# Patient Record
Sex: Female | Born: 1945 | Race: White | Hispanic: No | Marital: Single
Health system: Southern US, Community
[De-identification: ages and names within clinical notes are randomized; demographics above are authoritative.]

## PROBLEM LIST (undated history)

## (undated) DIAGNOSIS — U071 COVID-19: Secondary | ICD-10-CM

## (undated) DIAGNOSIS — I4891 Unspecified atrial fibrillation: Secondary | ICD-10-CM

## (undated) DIAGNOSIS — G9341 Metabolic encephalopathy: Secondary | ICD-10-CM

## (undated) DIAGNOSIS — J9621 Acute and chronic respiratory failure with hypoxia: Secondary | ICD-10-CM

## (undated) DIAGNOSIS — N170 Acute kidney failure with tubular necrosis: Secondary | ICD-10-CM

---

## 2020-08-16 ENCOUNTER — Other Ambulatory Visit (HOSPITAL_COMMUNITY): Payer: Medicare Other

## 2020-08-16 ENCOUNTER — Inpatient Hospital Stay
Admission: RE | Admit: 2020-08-16 | Discharge: 2020-10-12 | Disposition: E | Payer: Medicare Other | Source: Other Acute Inpatient Hospital | Attending: Internal Medicine | Admitting: Internal Medicine

## 2020-08-16 DIAGNOSIS — I4891 Unspecified atrial fibrillation: Secondary | ICD-10-CM | POA: Diagnosis present

## 2020-08-16 DIAGNOSIS — N179 Acute kidney failure, unspecified: Secondary | ICD-10-CM

## 2020-08-16 DIAGNOSIS — Z9911 Dependence on respirator [ventilator] status: Secondary | ICD-10-CM

## 2020-08-16 DIAGNOSIS — Z431 Encounter for attention to gastrostomy: Secondary | ICD-10-CM

## 2020-08-16 DIAGNOSIS — R0603 Acute respiratory distress: Secondary | ICD-10-CM

## 2020-08-16 DIAGNOSIS — Z95828 Presence of other vascular implants and grafts: Secondary | ICD-10-CM

## 2020-08-16 DIAGNOSIS — Z931 Gastrostomy status: Secondary | ICD-10-CM

## 2020-08-16 DIAGNOSIS — J9621 Acute and chronic respiratory failure with hypoxia: Secondary | ICD-10-CM | POA: Diagnosis present

## 2020-08-16 DIAGNOSIS — Z9689 Presence of other specified functional implants: Secondary | ICD-10-CM

## 2020-08-16 DIAGNOSIS — R112 Nausea with vomiting, unspecified: Secondary | ICD-10-CM

## 2020-08-16 DIAGNOSIS — Z4659 Encounter for fitting and adjustment of other gastrointestinal appliance and device: Secondary | ICD-10-CM

## 2020-08-16 DIAGNOSIS — J811 Chronic pulmonary edema: Secondary | ICD-10-CM

## 2020-08-16 DIAGNOSIS — T859XXA Unspecified complication of internal prosthetic device, implant and graft, initial encounter: Secondary | ICD-10-CM

## 2020-08-16 DIAGNOSIS — J939 Pneumothorax, unspecified: Secondary | ICD-10-CM

## 2020-08-16 DIAGNOSIS — R1110 Vomiting, unspecified: Secondary | ICD-10-CM

## 2020-08-16 DIAGNOSIS — J189 Pneumonia, unspecified organism: Secondary | ICD-10-CM

## 2020-08-16 DIAGNOSIS — N170 Acute kidney failure with tubular necrosis: Secondary | ICD-10-CM | POA: Diagnosis present

## 2020-08-16 DIAGNOSIS — R0902 Hypoxemia: Secondary | ICD-10-CM

## 2020-08-16 DIAGNOSIS — J9690 Respiratory failure, unspecified, unspecified whether with hypoxia or hypercapnia: Secondary | ICD-10-CM

## 2020-08-16 DIAGNOSIS — U071 COVID-19: Secondary | ICD-10-CM | POA: Diagnosis present

## 2020-08-16 DIAGNOSIS — G9341 Metabolic encephalopathy: Secondary | ICD-10-CM | POA: Diagnosis present

## 2020-08-16 HISTORY — DX: Acute kidney failure with tubular necrosis: N17.0

## 2020-08-16 HISTORY — DX: Acute and chronic respiratory failure with hypoxia: J96.21

## 2020-08-16 HISTORY — DX: Metabolic encephalopathy: G93.41

## 2020-08-16 HISTORY — DX: COVID-19: U07.1

## 2020-08-16 HISTORY — DX: Unspecified atrial fibrillation: I48.91

## 2020-08-16 LAB — BLOOD GAS, ARTERIAL
Acid-base deficit: 0.9 mmol/L (ref 0.0–2.0)
Bicarbonate: 27 mmol/L (ref 20.0–28.0)
FIO2: 60
O2 Saturation: 94.2 %
Patient temperature: 37.2
pCO2 arterial: 79.5 mmHg (ref 32.0–48.0)
pH, Arterial: 7.158 — CL (ref 7.350–7.450)
pO2, Arterial: 81.3 mmHg — ABNORMAL LOW (ref 83.0–108.0)

## 2020-08-16 MED ORDER — IOHEXOL 180 MG/ML  SOLN
50.0000 mL | Freq: Once | INTRAMUSCULAR | Status: AC | PRN
  Administered 2020-08-16: 50 mL

## 2020-08-17 ENCOUNTER — Encounter: Payer: Self-pay | Admitting: Internal Medicine

## 2020-08-17 DIAGNOSIS — U071 COVID-19: Secondary | ICD-10-CM

## 2020-08-17 DIAGNOSIS — J9621 Acute and chronic respiratory failure with hypoxia: Secondary | ICD-10-CM | POA: Diagnosis not present

## 2020-08-17 DIAGNOSIS — N170 Acute kidney failure with tubular necrosis: Secondary | ICD-10-CM

## 2020-08-17 DIAGNOSIS — I4891 Unspecified atrial fibrillation: Secondary | ICD-10-CM

## 2020-08-17 DIAGNOSIS — G9341 Metabolic encephalopathy: Secondary | ICD-10-CM

## 2020-08-17 LAB — CBC WITH DIFFERENTIAL/PLATELET
Abs Immature Granulocytes: 0.31 10*3/uL — ABNORMAL HIGH (ref 0.00–0.07)
Basophils Absolute: 0.1 10*3/uL (ref 0.0–0.1)
Basophils Relative: 0 %
Eosinophils Absolute: 0.1 10*3/uL (ref 0.0–0.5)
Eosinophils Relative: 0 %
HCT: 30 % — ABNORMAL LOW (ref 36.0–46.0)
Hemoglobin: 8.9 g/dL — ABNORMAL LOW (ref 12.0–15.0)
Immature Granulocytes: 2 %
Lymphocytes Relative: 10 %
Lymphs Abs: 1.6 10*3/uL (ref 0.7–4.0)
MCH: 28.9 pg (ref 26.0–34.0)
MCHC: 29.7 g/dL — ABNORMAL LOW (ref 30.0–36.0)
MCV: 97.4 fL (ref 80.0–100.0)
Monocytes Absolute: 0.5 10*3/uL (ref 0.1–1.0)
Monocytes Relative: 3 %
Neutro Abs: 12.9 10*3/uL — ABNORMAL HIGH (ref 1.7–7.7)
Neutrophils Relative %: 85 %
Platelets: 316 10*3/uL (ref 150–400)
RBC: 3.08 MIL/uL — ABNORMAL LOW (ref 3.87–5.11)
RDW: 20.4 % — ABNORMAL HIGH (ref 11.5–15.5)
WBC: 15.4 10*3/uL — ABNORMAL HIGH (ref 4.0–10.5)
nRBC: 0 % (ref 0.0–0.2)

## 2020-08-17 LAB — BLOOD GAS, ARTERIAL
Acid-Base Excess: 0 mmol/L (ref 0.0–2.0)
Acid-base deficit: 0.8 mmol/L (ref 0.0–2.0)
Acid-base deficit: 1 mmol/L (ref 0.0–2.0)
Acid-base deficit: 0.7 mmol/L (ref 0.0–2.0)
Acid-base deficit: 0.5 mmol/L (ref 0.0–2.0)
Bicarbonate: 26.2 mmol/L (ref 20.0–28.0)
Bicarbonate: 26.3 mmol/L (ref 20.0–28.0)
Bicarbonate: 25.6 mmol/L (ref 20.0–28.0)
Bicarbonate: 26.2 mmol/L (ref 20.0–28.0)
Bicarbonate: 26.1 mmol/L (ref 20.0–28.0)
FIO2: 60
FIO2: 55
FIO2: 55
FIO2: 55
FIO2: 60
O2 Saturation: 97.2 %
O2 Saturation: 94.6 %
O2 Saturation: 96.6 %
O2 Saturation: 95.5 %
O2 Saturation: 98.4 %
Patient temperature: 36.9
Patient temperature: 36.8
Patient temperature: 36.4
Patient temperature: 37.3
Patient temperature: 37
pCO2 arterial: 67.8 mmHg (ref 32.0–48.0)
pCO2 arterial: 59.8 mmHg — ABNORMAL HIGH (ref 32.0–48.0)
pCO2 arterial: 60.5 mmHg — ABNORMAL HIGH (ref 32.0–48.0)
pCO2 arterial: 68.3 mmHg (ref 32.0–48.0)
pCO2 arterial: 63.5 mmHg — ABNORMAL HIGH (ref 32.0–48.0)
pH, Arterial: 7.212 — ABNORMAL LOW (ref 7.350–7.450)
pH, Arterial: 7.264 — ABNORMAL LOW (ref 7.350–7.450)
pH, Arterial: 7.245 — ABNORMAL LOW (ref 7.350–7.450)
pH, Arterial: 7.211 — ABNORMAL LOW (ref 7.350–7.450)
pH, Arterial: 7.237 — ABNORMAL LOW (ref 7.350–7.450)
pO2, Arterial: 91.7 mmHg (ref 83.0–108.0)
pO2, Arterial: 72.5 mmHg — ABNORMAL LOW (ref 83.0–108.0)
pO2, Arterial: 83.9 mmHg (ref 83.0–108.0)
pO2, Arterial: 82.4 mmHg — ABNORMAL LOW (ref 83.0–108.0)
pO2, Arterial: 112 mmHg — ABNORMAL HIGH (ref 83.0–108.0)

## 2020-08-17 LAB — COMPREHENSIVE METABOLIC PANEL
ALT: 48 U/L — ABNORMAL HIGH (ref 0–44)
AST: 25 U/L (ref 15–41)
Albumin: 2.4 g/dL — ABNORMAL LOW (ref 3.5–5.0)
Alkaline Phosphatase: 113 U/L (ref 38–126)
Anion gap: 12 (ref 5–15)
BUN: 124 mg/dL — ABNORMAL HIGH (ref 8–23)
CO2: 26 mmol/L (ref 22–32)
Calcium: 8.5 mg/dL — ABNORMAL LOW (ref 8.9–10.3)
Chloride: 105 mmol/L (ref 98–111)
Creatinine, Ser: 2.13 mg/dL — ABNORMAL HIGH (ref 0.44–1.00)
GFR calc non Af Amer: 22 mL/min — ABNORMAL LOW (ref >60)
Glucose, Bld: 164 mg/dL — ABNORMAL HIGH (ref 70–99)
Potassium: 5.3 mmol/L — ABNORMAL HIGH (ref 3.5–5.1)
Sodium: 143 mmol/L (ref 135–145)
Total Bilirubin: 0.4 mg/dL (ref 0.3–1.2)
Total Protein: 5.9 g/dL — ABNORMAL LOW (ref 6.5–8.1)

## 2020-08-17 LAB — PHOSPHORUS: Phosphorus: 6.6 mg/dL — ABNORMAL HIGH (ref 2.5–4.6)

## 2020-08-17 LAB — PROTIME-INR
INR: 1.2 (ref 0.8–1.2)
Prothrombin Time: 15.1 seconds (ref 11.4–15.2)

## 2020-08-17 LAB — MAGNESIUM: Magnesium: 2 mg/dL (ref 1.7–2.4)

## 2020-08-17 NOTE — Consult Note (Signed)
Pulmonary Critical Care Medicine [facility] [place]  PULMONARY SERVICE  Date of Service: 08/17/2020  PULMONARY CRITICAL CARE CONSULT   Aimee Peck  XBL:[id number]  DOB: 1945/12/23   DOA: 09-14-20  Referring Physician: Carron Curie, MD  HPI: Aimee Peck is a 74 y.o. female seen for follow up of Acute on Chronic Respiratory Failure.  Patient has multiple medical problems including hypertension hyperlipidemia diabetes anxiety depression chronic pain syndrome morbid obesity atrial fibrillation came into the hospital because of cough body aches shortness of breath.  Patient was evaluated for COVID-19 was found to be positive.  Patient was started on remdesivir Decadron and Ticlid.  In addition was placed on antibiotics for MRSA pneumonia.  Her status continued to decline patient ended up intubated on the ventilator and subsequently was not able to come off of the ventilator.  She have to have a tracheostomy done transferred now to our facility for further management.  Review of Systems:  ROS performed and is unremarkable other than noted above.  Past medical history: Hypertension  hyperlipidemia  diabetes  anxiety  depression  chronic pain  morbid obesity  atrial fibrillation  COVID-19 MRSA bacteremia and fungal pneumonia  Past surgical history: Tracheostomy  Social history: Unknown tobacco alcohol or drug abuse  Family history: Noncontributory  Medications: Reviewed on Rounds  Physical Exam:  Vitals: Temperature is 98.2 pulse 88 respiratory 24 blood pressure is 115/57 saturations 96%  Ventilator Settings on pressure control mode was switched over to assist control mode because of asynchrony actually doing better on assist control FiO2 of 60% tidal volume 460 PEEP of 10  . General: Comfortable at this time . Eyes: Grossly normal lids, irises & conjunctiva . ENT: grossly tongue is normal . Neck: no obvious mass . Cardiovascular: S1-S2 normal no  gallop or rub . Respiratory: Coarse breath sounds noted bilaterally . Abdomen: Soft and nontender . Skin: no rash seen on limited exam . Musculoskeletal: not rigid . Psychiatric:unable to assess . Neurologic: no seizure no involuntary movements         Labs on Admission:  Basic Metabolic Panel: Recent Labs  Lab 08/17/20 1018  NA 143  K 5.3*  CL 105  CO2 26  GLUCOSE 164*  BUN 124*  CREATININE 2.13*  CALCIUM 8.5*  MG 2.0  PHOS 6.6*    Recent Labs  Lab 2020-09-14 2155 08/17/20 0100 08/17/20 0543 08/17/20 0930 08/17/20 1530  PHART 7.158* 7.212* 7.264* 7.245* 7.211*  PCO2ART 79.5* 67.8* 59.8* 60.5* 68.3*  PO2ART 81.3* 91.7 72.5* 83.9 82.4*  HCO3 27.0 26.2 26.3 25.6 26.2  O2SAT 94.2 97.2 94.6 96.6 95.5    Liver Function Tests: Recent Labs  Lab 08/17/20 1018  AST 25  ALT 48*  ALKPHOS 113  BILITOT 0.4  PROT 5.9*  ALBUMIN 2.4*   No results for input(s): LIPASE, AMYLASE in the last 168 hours. No results for input(s): AMMONIA in the last 168 hours.  CBC: Recent Labs  Lab 08/17/20 1018  WBC 15.4*  NEUTROABS 12.9*  HGB 8.9*  HCT 30.0*  MCV 97.4  PLT 316    Cardiac Enzymes: No results for input(s): CKTOTAL, CKMB, CKMBINDEX, TROPONINI in the last 168 hours.  BNP (last 3 results) No results for input(s): BNP in the last 8760 hours.  ProBNP (last 3 results) No results for input(s): PROBNP in the last 8760 hours.   Radiological Exams on Admission: DG ABDOMEN PEG TUBE LOCATION  Result Date: Sep 14, 2020 CLINICAL DATA:  Check gastrostomy catheter placed  EXAM: ABDOMEN - 1 VIEW COMPARISON:  None. FINDINGS: Gastrostomy catheter is noted within the stomach. Injected contrast passes into the stomach and subsequently into the duodenum. No obstructive changes are seen. IMPRESSION: Gastrostomy catheter within the stomach. Electronically Signed   By: Alcide Clever M.D.   On: 09-05-2020 22:44   DG Chest Port 1 View  Result Date: 09/05/20 CLINICAL DATA:  History of  respiratory failure EXAM: PORTABLE CHEST 1 VIEW COMPARISON:  None FINDINGS: Cardiac shadow is mildly enlarged but accentuated by the portable technique. Tracheostomy tube and right jugular central line are seen in satisfactory position. Diffuse airspace opacities are noted bilaterally. No sizable effusion or pneumothorax is noted. No bony abnormality is seen. IMPRESSION: Diffuse airspace opacities bilaterally. Although portion of this may be related to pulmonary edema possibility of underlying multifocal pneumonia deserves consideration as well. Clinical correlation is recommended. Electronically Signed   By: Alcide Clever M.D.   On: 09/05/20 22:44    Assessment/Plan Active Problems:   Acute on chronic respiratory failure with hypoxia (HCC)   COVID-19 virus infection   Atrial fibrillation with RVR (HCC)   Acute renal failure due to tubular necrosis (HCC)   Metabolic encephalopathy   1. Acute on chronic respiratory failure with hypoxia patient is on the ventilator we had to switch her over to assist control.  Reviewed the chest chest x-ray which reveals diffuse bilateral infiltrates.  There may very well be some element of heart failure. 2. COVID-19 virus infection in recovery phase patient has diffuse damage noted from a pulmonary perspective. 3. Atrial fibrillation with RVR we will continue with controlling the rate.  Patient did have a echo done with an EF of 65%. 4. Acute renal failure tubular necrosis creatinine 2.13 BUN 124 May need to consider nephrology evaluation. 5. Metabolic encephalopathy multifactorial we will continue to monitor.  I have personally seen and evaluated the patient, evaluated laboratory and imaging results, formulated the assessment and plan and placed orders. The Patient requires high complexity decision making with multiple systems involvement.  Case was discussed on Rounds with the Respiratory Therapy Director and the Respiratory staff Time Spent   Yevonne Pax, MD [facility] Pulmonary Critical Care Medicine Sleep Medicine

## 2020-08-18 DIAGNOSIS — I4891 Unspecified atrial fibrillation: Secondary | ICD-10-CM | POA: Diagnosis not present

## 2020-08-18 DIAGNOSIS — J9621 Acute and chronic respiratory failure with hypoxia: Secondary | ICD-10-CM | POA: Diagnosis not present

## 2020-08-18 DIAGNOSIS — N170 Acute kidney failure with tubular necrosis: Secondary | ICD-10-CM | POA: Diagnosis not present

## 2020-08-18 DIAGNOSIS — U071 COVID-19: Secondary | ICD-10-CM | POA: Diagnosis not present

## 2020-08-18 LAB — BLOOD GAS, ARTERIAL
Acid-Base Excess: 2.9 mmol/L — ABNORMAL HIGH (ref 0.0–2.0)
Bicarbonate: 28.3 mmol/L — ABNORMAL HIGH (ref 20.0–28.0)
FIO2: 50
O2 Saturation: 96.5 %
Patient temperature: 37
pCO2 arterial: 54.6 mmHg — ABNORMAL HIGH (ref 32.0–48.0)
pH, Arterial: 7.335 — ABNORMAL LOW (ref 7.350–7.450)
pO2, Arterial: 78.3 mmHg — ABNORMAL LOW (ref 83.0–108.0)

## 2020-08-18 LAB — URINALYSIS, ROUTINE W REFLEX MICROSCOPIC
Bilirubin Urine: NEGATIVE
Glucose, UA: NEGATIVE mg/dL
Ketones, ur: NEGATIVE mg/dL
Nitrite: NEGATIVE
Protein, ur: 30 mg/dL — AB
Specific Gravity, Urine: 1.012 (ref 1.005–1.030)
pH: 5 (ref 5.0–8.0)

## 2020-08-18 LAB — RENAL FUNCTION PANEL
Albumin: 2.4 g/dL — ABNORMAL LOW (ref 3.5–5.0)
Anion gap: 12 (ref 5–15)
BUN: 139 mg/dL — ABNORMAL HIGH (ref 8–23)
CO2: 28 mmol/L (ref 22–32)
Calcium: 8.5 mg/dL — ABNORMAL LOW (ref 8.9–10.3)
Chloride: 106 mmol/L (ref 98–111)
Creatinine, Ser: 2.28 mg/dL — ABNORMAL HIGH (ref 0.44–1.00)
GFR calc non Af Amer: 20 mL/min — ABNORMAL LOW (ref >60)
Glucose, Bld: 175 mg/dL — ABNORMAL HIGH (ref 70–99)
Phosphorus: 6.1 mg/dL — ABNORMAL HIGH (ref 2.5–4.6)
Potassium: 4.5 mmol/L (ref 3.5–5.1)
Sodium: 146 mmol/L — ABNORMAL HIGH (ref 135–145)

## 2020-08-18 NOTE — Progress Notes (Signed)
Pulmonary Critical Care Medicine [facility] [place]   PULMONARY CRITICAL CARE SERVICE  PROGRESS NOTE  Date of Service: 08/18/2020  Aimee Peck  BPZ:[id number]  DOB: Jul 31, 1946   DOA: 09/06/2020  Referring Physician: Carron Curie, MD  HPI: Aimee Peck is a 74 y.o. female seen for follow up of Acute on Chronic Respiratory Failure.  Patient appears to be doing a little bit better after changing the vent settings yesterday now the patient is on AC VC plus  Medications: Reviewed on Rounds  Physical Exam:  Vitals: Temperature 97.6 pulse 89 respiratory rate 29 blood pressure is 131/54 saturations 95%  Ventilator Settings on assist control FiO2 50% tidal volume 460 PEEP 10  . General: Comfortable at this time . Eyes: Grossly normal lids, irises & conjunctiva . ENT: grossly tongue is normal . Neck: no obvious mass . Cardiovascular: S1 S2 normal no gallop . Respiratory: Scattered rhonchi expansion is equal . Abdomen: soft . Skin: no rash seen on limited exam . Musculoskeletal: not rigid . Psychiatric:unable to assess . Neurologic: no seizure no involuntary movements         Lab Data:   Basic Metabolic Panel: Recent Labs  Lab 08/17/20 1018 08/18/20 0554  NA 143 146*  K 5.3* 4.5  CL 105 106  CO2 26 28  GLUCOSE 164* 175*  BUN 124* 139*  CREATININE 2.13* 2.28*  CALCIUM 8.5* 8.5*  MG 2.0  --   PHOS 6.6* 6.1*    ABG: Recent Labs  Lab 08/17/20 0543 08/17/20 0930 08/17/20 1530 08/17/20 2034 08/18/20 0148  PHART 7.264* 7.245* 7.211* 7.237* 7.335*  PCO2ART 59.8* 60.5* 68.3* 63.5* 54.6*  PO2ART 72.5* 83.9 82.4* 112* 78.3*  HCO3 26.3 25.6 26.2 26.1 28.3*  O2SAT 94.6 96.6 95.5 98.4 96.5    Liver Function Tests: Recent Labs  Lab 08/17/20 1018 08/18/20 0554  AST 25  --   ALT 48*  --   ALKPHOS 113  --   BILITOT 0.4  --   PROT 5.9*  --   ALBUMIN 2.4* 2.4*   No results for input(s): LIPASE, AMYLASE in the last 168 hours. No results for  input(s): AMMONIA in the last 168 hours.  CBC: Recent Labs  Lab 08/17/20 1018  WBC 15.4*  NEUTROABS 12.9*  HGB 8.9*  HCT 30.0*  MCV 97.4  PLT 316    Cardiac Enzymes: No results for input(s): CKTOTAL, CKMB, CKMBINDEX, TROPONINI in the last 168 hours.  BNP (last 3 results) No results for input(s): BNP in the last 8760 hours.  ProBNP (last 3 results) No results for input(s): PROBNP in the last 8760 hours.  Radiological Exams: DG ABDOMEN PEG TUBE LOCATION  Result Date: 08/30/2020 CLINICAL DATA:  Check gastrostomy catheter placed EXAM: ABDOMEN - 1 VIEW COMPARISON:  None. FINDINGS: Gastrostomy catheter is noted within the stomach. Injected contrast passes into the stomach and subsequently into the duodenum. No obstructive changes are seen. IMPRESSION: Gastrostomy catheter within the stomach. Electronically Signed   By: Alcide Clever M.D.   On: 09/01/2020 22:44   DG Chest Port 1 View  Result Date: 08/17/2020 CLINICAL DATA:  History of respiratory failure EXAM: PORTABLE CHEST 1 VIEW COMPARISON:  None FINDINGS: Cardiac shadow is mildly enlarged but accentuated by the portable technique. Tracheostomy tube and right jugular central line are seen in satisfactory position. Diffuse airspace opacities are noted bilaterally. No sizable effusion or pneumothorax is noted. No bony abnormality is seen. IMPRESSION: Diffuse airspace opacities bilaterally. Although portion of this may  be related to pulmonary edema possibility of underlying multifocal pneumonia deserves consideration as well. Clinical correlation is recommended. Electronically Signed   By: Alcide Clever M.D.   On: 08/13/2020 22:44    Assessment/Plan Active Problems:   Acute on chronic respiratory failure with hypoxia (HCC)   COVID-19 virus infection   Atrial fibrillation with RVR (HCC)   Acute renal failure due to tubular necrosis (HCC)   Metabolic encephalopathy   1. Acute on chronic respiratory failure with hypoxia on assist  control with an FiO2 of 50% 2. COVID-19 virus infection in resolution 3. Atrial fibrillation with RVR has been treated slow improvement. 4. Acute renal failure with tubular necrosis resolving 5. Metabolic encephalopathy supportive care   I have personally seen and evaluated the patient, evaluated laboratory and imaging results, formulated the assessment and plan and placed orders. The Patient requires high complexity decision making with multiple systems involvement.  Rounds were done with the Respiratory Therapy Director and Staff therapists and discussed with nursing staff also.  Yevonne Pax, MD [facility] Pulmonary Critical Care Medicine Sleep Medicine

## 2020-08-19 DIAGNOSIS — U071 COVID-19: Secondary | ICD-10-CM | POA: Diagnosis not present

## 2020-08-19 DIAGNOSIS — N170 Acute kidney failure with tubular necrosis: Secondary | ICD-10-CM | POA: Diagnosis not present

## 2020-08-19 DIAGNOSIS — J9621 Acute and chronic respiratory failure with hypoxia: Secondary | ICD-10-CM | POA: Diagnosis not present

## 2020-08-19 DIAGNOSIS — I4891 Unspecified atrial fibrillation: Secondary | ICD-10-CM | POA: Diagnosis not present

## 2020-08-19 LAB — URINE CULTURE: Culture: >=100000 — AB

## 2020-08-19 NOTE — Progress Notes (Addendum)
Pulmonary Critical Care Medicine [facility] [place]   PULMONARY CRITICAL CARE SERVICE  PROGRESS NOTE  Date of Service: 08/19/2020  Aimee Peck  DGU:[id number]  DOB: 1946-06-05   DOA: 08/29/20  Referring Physician: Carron Curie, MD  HPI: Aimee Peck is a 74 y.o. female seen for follow up of Acute on Chronic Respiratory Failure.  Patient remains on full support on the vent at this time currently requiring rate of 28 FiO2 50% satting well.  Medications: Reviewed on Rounds  Physical Exam:  Vitals: Pulse 100 respirations 26 BP 137/76 O2 sat 96% temp 98.0  Ventilator Settings ventilator mode AC VC plus rate of 28 tidal volume 460 PEEP of 10 with FiO2 50%  . General: Comfortable at this time . Eyes: Grossly normal lids, irises & conjunctiva . ENT: grossly tongue is normal . Neck: no obvious mass . Cardiovascular: S1 S2 normal no gallop . Respiratory: Coarse breath sounds . Abdomen: soft . Skin: no rash seen on limited exam . Musculoskeletal: not rigid . Psychiatric:unable to assess . Neurologic: no seizure no involuntary movements         Lab Data:   Basic Metabolic Panel: Recent Labs  Lab 08/17/20 1018 08/18/20 0554  NA 143 146*  K 5.3* 4.5  CL 105 106  CO2 26 28  GLUCOSE 164* 175*  BUN 124* 139*  CREATININE 2.13* 2.28*  CALCIUM 8.5* 8.5*  MG 2.0  --   PHOS 6.6* 6.1*    ABG: Recent Labs  Lab 08/17/20 0543 08/17/20 0930 08/17/20 1530 08/17/20 2034 08/18/20 0148  PHART 7.264* 7.245* 7.211* 7.237* 7.335*  PCO2ART 59.8* 60.5* 68.3* 63.5* 54.6*  PO2ART 72.5* 83.9 82.4* 112* 78.3*  HCO3 26.3 25.6 26.2 26.1 28.3*  O2SAT 94.6 96.6 95.5 98.4 96.5    Liver Function Tests: Recent Labs  Lab 08/17/20 1018 08/18/20 0554  AST 25  --   ALT 48*  --   ALKPHOS 113  --   BILITOT 0.4  --   PROT 5.9*  --   ALBUMIN 2.4* 2.4*   No results for input(s): LIPASE, AMYLASE in the last 168 hours. No results for input(s): AMMONIA in the last 168  hours.  CBC: Recent Labs  Lab 08/17/20 1018  WBC 15.4*  NEUTROABS 12.9*  HGB 8.9*  HCT 30.0*  MCV 97.4  PLT 316    Cardiac Enzymes: No results for input(s): CKTOTAL, CKMB, CKMBINDEX, TROPONINI in the last 168 hours.  BNP (last 3 results) No results for input(s): BNP in the last 8760 hours.  ProBNP (last 3 results) No results for input(s): PROBNP in the last 8760 hours.  Radiological Exams: No results found.  Assessment/Plan Active Problems:   Acute on chronic respiratory failure with hypoxia (HCC)   COVID-19 virus infection   Atrial fibrillation with RVR (HCC)   Acute renal failure due to tubular necrosis (HCC)   Metabolic encephalopathy   1. Acute on chronic respiratory failure with hypoxia patient will remain on full support at this time we will continue to wean FiO2 as tolerated.  Continue aggressive pulmonary toilet supportive measures. 2. COVID-19 virus infection in resolution 3. Atrial fibrillation with RVR has been treated slow improvement. 4. Acute renal failure with tubular necrosis resolving 5. Metabolic encephalopathy supportive care   I have personally seen and evaluated the patient, evaluated laboratory and imaging results, formulated the assessment and plan and placed orders. The Patient requires high complexity decision making with multiple systems involvement.  Rounds were done with the  Respiratory Therapy Director and Staff therapists and discussed with nursing staff also.  Allyne Gee, MD [facility] Pulmonary Critical Care Medicine Sleep Medicine

## 2020-08-19 NOTE — Consult Note (Addendum)
[facility] CONSULT NOTE    Date: 08/19/2020                  Patient Name:  Aimee Peck  MRN: [id number]  DOB: Dec 06, 1945  Age / Sex: 74 y.o., female         PCP: Patient, No Pcp Per                 Service Requesting Consult:  Hospitalist                 Reason for Consult:  Acute kidney injury, generalized edema            History of Present Illness: Patient is a 74 y.o. female with a PMHx of recent acute respiratory failure secondary to COVID-19 pneumonia, hypertension, hyperlipidemia, diabetes mellitus type 2, depression, anxiety, obesity, atrial fibrillation, history of MRSA bacteremia and fungal pneumonia, recent acute kidney injury, who was admitted to [facility] on 08/26/2020 for ongoing treatment of acute respiratory failure.  At the outside hospital patient was diagnosed with COVID-19 pneumonia.  She was treated with steroids and remdesivir.  Unfortunately she still had persistent acute respiratory failure and is status post tracheostomy placement.  We are now asked to see her for evaluation management of acute kidney injury.  The patient is producing urine and made 1 L of urine yesterday.  She is noted to be significantly volume overloaded and has generalized edema.  Patient requiring significant oxygen on the vent and requiring PEEP of 10.  Current BUN is 139 with a creatinine of 2.28.  She was found to have an element of acute kidney injury at the outside hospital as well.  Her baseline creatinine appears to be 0.9.  She also has signs of acidosis with pH of 7.3 PCO2 of 54.6.   Medications:  Current medications: D5 half normal saline, folic acid 2 mg IV daily, methylprednisolone 40 mg IV daily, Humalog sliding scale, amantadine 100 mg daily, amiodarone 200 mg twice daily, amlodipine 10 mg daily, Eliquis 2.5 mg twice daily, fish oil 4 g daily, fluoxetine 40 mg daily, hydroxyurea 500 mg twice daily, melatonin 3 mg nightly, metoprolol 50 mg twice daily,  MiraLAX 17 g daily, modafinil 100 mg every morning, pantoprazole 40 mg twice daily, prostat 30 cc twice daily  Allergies: Atorvastatin, pravastatin   Past Medical History: Past Medical History:  Diagnosis Date   Acute on chronic respiratory failure with hypoxia (HCC)    Acute renal failure due to tubular necrosis (HCC)    Atrial fibrillation with RVR (HCC)    COVID-19 virus infection    Metabolic encephalopathy   hypertension Hyperlipidemia diabetes mellitus type 2 Depression Anxiety Obesity history of MRSA bacteremia and fungal pneumonia Acute kidney injury   Past Surgical History: Status post tracheostomy placement  Family History: Unable to obtain from the patient and she is currently on the ventilator  Social History: Unable to obtain from the patient as she is currently on the ventilator  Review of Systems: Unable to obtain from the patient as she is currently on the ventilator  Vital Signs: Temperature 98 pulse 100 respirations 26 blood pressure 133/76 urine output 1050  Weight trends: There were no vitals filed for this visit.  Physical Exam: Physical Exam: General:  Critically ill-appearing  Head:  Normocephalic, atraumatic. Moist oral mucosal membranes  Eyes:  Anicteric  Neck:  Tracheostomy in place  Lungs:   Coarse breath sounds bilateral, vent assisted  Heart:  S1S2  no rubs irregular at times  Abdomen:   Soft, nontender, bowel sounds present  Extremities:  Marked edema in all 4 extremities  Neurologic:  Awake, not following commands  Skin:  No acute rashes  Access:  No dialysis access at the moment    Lab results: Basic Metabolic Panel: Recent Labs  Lab 08/17/20 1018 08/18/20 0554  NA 143 146*  K 5.3* 4.5  CL 105 106  CO2 26 28  GLUCOSE 164* 175*  BUN 124* 139*  CREATININE 2.13* 2.28*  CALCIUM 8.5* 8.5*  MG 2.0  --   PHOS 6.6* 6.1*    Liver Function Tests: Recent Labs  Lab 08/17/20 1018 08/18/20 0554  AST 25  --   ALT  48*  --   ALKPHOS 113  --   BILITOT 0.4  --   PROT 5.9*  --   ALBUMIN 2.4* 2.4*   No results for input(s): LIPASE, AMYLASE in the last 168 hours. No results for input(s): AMMONIA in the last 168 hours.  CBC: Recent Labs  Lab 08/17/20 1018  WBC 15.4*  NEUTROABS 12.9*  HGB 8.9*  HCT 30.0*  MCV 97.4  PLT 316    Cardiac Enzymes: No results for input(s): CKTOTAL, CKMB, CKMBINDEX, TROPONINI in the last 168 hours.  BNP: Invalid input(s): POCBNP  CBG: No results for input(s): GLUCAP in the last 168 hours.  Microbiology: Results for orders placed or performed during the hospital encounter of 09/06/2020  Urine Culture     Status: Abnormal (Preliminary result)   Collection Time: 08/17/20  1:32 PM   Specimen: Urine, Random  Result Value Ref Range Status   Specimen Description URINE, RANDOM  Final   Special Requests NONE  Final   Culture (A)  Final    >=100,000 COLONIES/mL GRAM NEGATIVE RODS IDENTIFICATION AND SUSCEPTIBILITIES TO FOLLOW Performed at [facility], [address], [place], [place] [address]    Report Status PENDING  Incomplete  Culture, blood (Routine X 2) w Reflex to ID Panel     Status: None (Preliminary result)   Collection Time: 08/17/20  1:54 PM   Specimen: BLOOD LEFT HAND  Result Value Ref Range Status   Specimen Description BLOOD LEFT HAND  Final   Special Requests   Final    BOTTLES DRAWN AEROBIC AND ANAEROBIC Blood Culture adequate volume   Culture   Final    NO GROWTH 2 DAYS Performed at [facility], [address], [place], [place] [address]    Report Status PENDING  Incomplete  Culture, blood (Routine X 2) w Reflex to ID Panel     Status: None (Preliminary result)   Collection Time: 08/17/20  2:15 PM   Specimen: BLOOD LEFT ARM  Result Value Ref Range Status   Specimen Description BLOOD LEFT ARM  Final   Special Requests   Final    BOTTLES DRAWN AEROBIC ONLY Blood Culture results may not be optimal due to an inadequate volume of  blood received in culture bottles   Culture   Final    NO GROWTH 2 DAYS Performed at [facility], [address] [place], [place] [address]    Report Status PENDING  Incomplete  Culture, respiratory (non-expectorated)     Status: None (Preliminary result)   Collection Time: 08/17/20  6:14 PM   Specimen: Tracheal Aspirate; Respiratory  Result Value Ref Range Status   Specimen Description TRACHEAL ASPIRATE  Final   Special Requests NONE  Final  Gram Stain   Final    RARE WBC PRESENT, PREDOMINANTLY PMN FEW GRAM NEGATIVE RODS Performed at [facility], [address], [place], [place] [address]    Culture PENDING  Incomplete   Report Status PENDING  Incomplete    Coagulation Studies: Recent Labs    08/17/20 1018  LABPROT 15.1  INR 1.2    Urinalysis: Recent Labs    08/18/20 0133  COLORURINE YELLOW  LABSPEC 1.012  PHURINE 5.0  GLUCOSEU NEGATIVE  HGBUR MODERATE*  BILIRUBINUR NEGATIVE  KETONESUR NEGATIVE  PROTEINUR 30*  NITRITE NEGATIVE  LEUKOCYTESUR LARGE*      Imaging:  No results found.   Assessment & Plan: Pt is a 74 y.o. female with a PMHx of recent acute respiratory failure secondary to COVID-19 pneumonia, hypertension, hyperlipidemia, diabetes mellitus type 2, depression, anxiety, obesity, atrial fibrillation, history of MRSA bacteremia and fungal pneumonia, recent acute kidney injury, who was admitted to [facility] on Aug 26, 2020 for ongoing treatment of acute respiratory failure.   1.  Acute kidney injury.  Patient with elevations in BUN creatinine currently at 139 and 2.28.  Patient did make slightly over 1 L of urine yesterday.  No immediate need for dialysis however suspect that with treatment for edema her BUN and creatinine will further rise which may necessitate dialysis.  However patient is clearly volume overloaded at the moment.  We will discuss this a bit further below.  2.  Generalized edema/volume overload.  Patient with significant volume  on board.  Therefore we plan to perform diuresis using Lasix drip at 6 mg/h.  Also recommend albumin 25 g IV every 12 hours to help pull fluid from third space.  However suspect that this will worsen renal function which may then necessitate initiation of dialysis.  3.  Acute respiratory failure.  Maintain the patient on ventilatory support.  Requiring PEEP of 10 at the moment.  4.  Thanks for consultation.

## 2020-08-20 DIAGNOSIS — U071 COVID-19: Secondary | ICD-10-CM | POA: Diagnosis not present

## 2020-08-20 DIAGNOSIS — N170 Acute kidney failure with tubular necrosis: Secondary | ICD-10-CM | POA: Diagnosis not present

## 2020-08-20 DIAGNOSIS — J9621 Acute and chronic respiratory failure with hypoxia: Secondary | ICD-10-CM | POA: Diagnosis not present

## 2020-08-20 DIAGNOSIS — I4891 Unspecified atrial fibrillation: Secondary | ICD-10-CM | POA: Diagnosis not present

## 2020-08-20 LAB — RENAL FUNCTION PANEL
Albumin: 2.9 g/dL — ABNORMAL LOW (ref 3.5–5.0)
Anion gap: 14 (ref 5–15)
BUN: 154 mg/dL — ABNORMAL HIGH (ref 8–23)
CO2: 28 mmol/L (ref 22–32)
Calcium: 8.8 mg/dL — ABNORMAL LOW (ref 8.9–10.3)
Chloride: 105 mmol/L (ref 98–111)
Creatinine, Ser: 2.09 mg/dL — ABNORMAL HIGH (ref 0.44–1.00)
GFR, Estimated: 23 mL/min — ABNORMAL LOW (ref >60)
Glucose, Bld: 190 mg/dL — ABNORMAL HIGH (ref 70–99)
Phosphorus: 5.8 mg/dL — ABNORMAL HIGH (ref 2.5–4.6)
Potassium: 4.3 mmol/L (ref 3.5–5.1)
Sodium: 147 mmol/L — ABNORMAL HIGH (ref 135–145)

## 2020-08-20 LAB — CULTURE, RESPIRATORY W GRAM STAIN

## 2020-08-20 LAB — CBC
HCT: 26.5 % — ABNORMAL LOW (ref 36.0–46.0)
Hemoglobin: 7.8 g/dL — ABNORMAL LOW (ref 12.0–15.0)
MCH: 28.2 pg (ref 26.0–34.0)
MCHC: 29.4 g/dL — ABNORMAL LOW (ref 30.0–36.0)
MCV: 95.7 fL (ref 80.0–100.0)
Platelets: 258 10*3/uL (ref 150–400)
RBC: 2.77 MIL/uL — ABNORMAL LOW (ref 3.87–5.11)
RDW: 19.6 % — ABNORMAL HIGH (ref 11.5–15.5)
WBC: 11 10*3/uL — ABNORMAL HIGH (ref 4.0–10.5)
nRBC: 0.2 % (ref 0.0–0.2)

## 2020-08-20 NOTE — Progress Notes (Signed)
Pulmonary Critical Care Medicine [facility] [place]   PULMONARY CRITICAL CARE SERVICE  PROGRESS NOTE  Date of Service: 08/20/2020  Aimee Peck  LZJ:[id number]  DOB: [birth year]   DOA: 08/29/2020  Referring Physician: Carron Curie, MD  HPI: Aimee Peck is a 74 y.o. female seen for follow up of Acute on Chronic Respiratory Failure.  Patient at this time is on full support on the ventilator.  Has not been tolerating weaning.  Now is on assist control mode and requiring 50% FiO2  Medications: Reviewed on Rounds  Physical Exam:  Vitals: Temperature is 98.8 pulse 100 respiratory rate 31 blood pressure is 112/73 saturations 95%  Ventilator Settings on assist control FiO2 50% tidal volume 606 PEEP 10  . General: Comfortable at this time . Eyes: Grossly normal lids, irises & conjunctiva . ENT: grossly tongue is normal . Neck: no obvious mass . Cardiovascular: S1 S2 normal no gallop . Respiratory: Scattered rhonchi very coarse breath sounds . Abdomen: soft . Skin: no rash seen on limited exam . Musculoskeletal: not rigid . Psychiatric:unable to assess . Neurologic: no seizure no involuntary movements         Lab Data:   Basic Metabolic Panel: Recent Labs  Lab 08/17/20 1018 08/18/20 0554 08/20/20 0618  NA 143 146* 147*  K 5.3* 4.5 4.3  CL 105 106 105  CO2 26 28 28   GLUCOSE 164* 175* 190*  BUN 124* 139* 154*  CREATININE 2.13* 2.28* 2.09*  CALCIUM 8.5* 8.5* 8.8*  MG 2.0  --   --   PHOS 6.6* 6.1* 5.8*    ABG: Recent Labs  Lab 08/17/20 0543 08/17/20 0930 08/17/20 1530 08/17/20 2034 08/18/20 0148  PHART 7.264* 7.245* 7.211* 7.237* 7.335*  PCO2ART 59.8* 60.5* 68.3* 63.5* 54.6*  PO2ART 72.5* 83.9 82.4* 112* 78.3*  HCO3 26.3 25.6 26.2 26.1 28.3*  O2SAT 94.6 96.6 95.5 98.4 96.5    Liver Function Tests: Recent Labs  Lab 08/17/20 1018 08/18/20 0554 08/20/20 0618  AST 25  --   --   ALT 48*  --   --   ALKPHOS 113  --   --   BILITOT 0.4  --    --   PROT 5.9*  --   --   ALBUMIN 2.4* 2.4* 2.9*   No results for input(s): LIPASE, AMYLASE in the last 168 hours. No results for input(s): AMMONIA in the last 168 hours.  CBC: Recent Labs  Lab 08/17/20 1018 08/20/20 0618  WBC 15.4* 11.0*  NEUTROABS 12.9*  --   HGB 8.9* 7.8*  HCT 30.0* 26.5*  MCV 97.4 95.7  PLT 316 258    Cardiac Enzymes: No results for input(s): CKTOTAL, CKMB, CKMBINDEX, TROPONINI in the last 168 hours.  BNP (last 3 results) No results for input(s): BNP in the last 8760 hours.  ProBNP (last 3 results) No results for input(s): PROBNP in the last 8760 hours.  Radiological Exams: No results found.  Assessment/Plan Active Problems:   Acute on chronic respiratory failure with hypoxia (HCC)   COVID-19 virus infection   Atrial fibrillation with RVR (HCC)   Acute renal failure due to tubular necrosis (HCC)   Metabolic encephalopathy   1. Acute on chronic respiratory failure hypoxia we will continue with assessing the RSB I mechanics try to wean. 2. COVID-19 virus infection in recovery we will continue to follow 3. Atrial fibrillation RVR rate is controlled 4. Acute renal failure tubular necrosis supportive care 5. Metabolic encephalopathy no change  I have personally seen and evaluated the patient, evaluated laboratory and imaging results, formulated the assessment and plan and placed orders. The Patient requires high complexity decision making with multiple systems involvement.  Rounds were done with the Respiratory Therapy Director and Staff therapists and discussed with nursing staff also.  Allyne Gee, MD [facility] Pulmonary Critical Care Medicine Sleep Medicine

## 2020-08-21 DIAGNOSIS — J9621 Acute and chronic respiratory failure with hypoxia: Secondary | ICD-10-CM | POA: Diagnosis not present

## 2020-08-21 DIAGNOSIS — I4891 Unspecified atrial fibrillation: Secondary | ICD-10-CM | POA: Diagnosis not present

## 2020-08-21 DIAGNOSIS — U071 COVID-19: Secondary | ICD-10-CM | POA: Diagnosis not present

## 2020-08-21 DIAGNOSIS — N170 Acute kidney failure with tubular necrosis: Secondary | ICD-10-CM | POA: Diagnosis not present

## 2020-08-21 LAB — RENAL FUNCTION PANEL
Albumin: 3.1 g/dL — ABNORMAL LOW (ref 3.5–5.0)
Anion gap: 15 (ref 5–15)
BUN: 159 mg/dL — ABNORMAL HIGH (ref 8–23)
CO2: 25 mmol/L (ref 22–32)
Calcium: 8.4 mg/dL — ABNORMAL LOW (ref 8.9–10.3)
Chloride: 106 mmol/L (ref 98–111)
Creatinine, Ser: 2.19 mg/dL — ABNORMAL HIGH (ref 0.44–1.00)
GFR, Estimated: 21 mL/min — ABNORMAL LOW (ref >60)
Glucose, Bld: 199 mg/dL — ABNORMAL HIGH (ref 70–99)
Phosphorus: 5.9 mg/dL — ABNORMAL HIGH (ref 2.5–4.6)
Potassium: 4.5 mmol/L (ref 3.5–5.1)
Sodium: 146 mmol/L — ABNORMAL HIGH (ref 135–145)

## 2020-08-21 NOTE — Progress Notes (Signed)
Pulmonary Critical Care Medicine [facility] [place]   PULMONARY CRITICAL CARE SERVICE  PROGRESS NOTE  Date of Service: 08/21/2020  Aimee Peck  BSW:[id number]  DOB: Oct 11, 1946   DOA: 08/17/2020  Referring Physician: Carron Curie, MD  HPI: Aimee Peck is a 74 y.o. female seen for follow up of Acute on Chronic Respiratory Failure.  Right now on full support on the ventilator assist control mode FiO2 was decreased down to 40% PEEP still at 10  Medications: Reviewed on Rounds  Physical Exam:  Vitals: Temperature is 97.9 pulse 92 respiratory 28 blood pressure is 119/84 saturations 96%  Ventilator Settings on assist control FiO2 is 40% tidal volume 460 PEEP 10  . General: Comfortable at this time . Eyes: Grossly normal lids, irises & conjunctiva . ENT: grossly tongue is normal . Neck: no obvious mass . Cardiovascular: S1 S2 normal no gallop . Respiratory: Scattered rhonchi expansion is equal . Abdomen: soft . Skin: no rash seen on limited exam . Musculoskeletal: not rigid . Psychiatric:unable to assess . Neurologic: no seizure no involuntary movements         Lab Data:   Basic Metabolic Panel: Recent Labs  Lab 08/17/20 1018 08/18/20 0554 08/20/20 0618 08/21/20 0359  NA 143 146* 147* 146*  K 5.3* 4.5 4.3 4.5  CL 105 106 105 106  CO2 26 28 28 25   GLUCOSE 164* 175* 190* 199*  BUN 124* 139* 154* 159*  CREATININE 2.13* 2.28* 2.09* 2.19*  CALCIUM 8.5* 8.5* 8.8* 8.4*  MG 2.0  --   --   --   PHOS 6.6* 6.1* 5.8* 5.9*    ABG: Recent Labs  Lab 08/17/20 0543 08/17/20 0930 08/17/20 1530 08/17/20 2034 08/18/20 0148  PHART 7.264* 7.245* 7.211* 7.237* 7.335*  PCO2ART 59.8* 60.5* 68.3* 63.5* 54.6*  PO2ART 72.5* 83.9 82.4* 112* 78.3*  HCO3 26.3 25.6 26.2 26.1 28.3*  O2SAT 94.6 96.6 95.5 98.4 96.5    Liver Function Tests: Recent Labs  Lab 08/17/20 1018 08/18/20 0554 08/20/20 0618 08/21/20 0359  AST 25  --   --   --   ALT 48*  --   --   --    ALKPHOS 113  --   --   --   BILITOT 0.4  --   --   --   PROT 5.9*  --   --   --   ALBUMIN 2.4* 2.4* 2.9* 3.1*   No results for input(s): LIPASE, AMYLASE in the last 168 hours. No results for input(s): AMMONIA in the last 168 hours.  CBC: Recent Labs  Lab 08/17/20 1018 08/20/20 0618  WBC 15.4* 11.0*  NEUTROABS 12.9*  --   HGB 8.9* 7.8*  HCT 30.0* 26.5*  MCV 97.4 95.7  PLT 316 258    Cardiac Enzymes: No results for input(s): CKTOTAL, CKMB, CKMBINDEX, TROPONINI in the last 168 hours.  BNP (last 3 results) No results for input(s): BNP in the last 8760 hours.  ProBNP (last 3 results) No results for input(s): PROBNP in the last 8760 hours.  Radiological Exams: No results found.  Assessment/Plan Active Problems:   Acute on chronic respiratory failure with hypoxia (HCC)   COVID-19 virus infection   Atrial fibrillation with RVR (HCC)   Acute renal failure due to tubular necrosis (HCC)   Metabolic encephalopathy   1. Acute on chronic respiratory failure hypoxia we will continue with trying to wean the PEEP down at this point FiO2 is already down to 40%. 2. COVID-19  virus infection in resolution 3. Atrial fibrillation rate controlled 4. Acute renal failure following labs nephrology has been consulted 5. Metabolic encephalopathy no change   I have personally seen and evaluated the patient, evaluated laboratory and imaging results, formulated the assessment and plan and placed orders. The Patient requires high complexity decision making with multiple systems involvement.  Rounds were done with the Respiratory Therapy Director and Staff therapists and discussed with nursing staff also.  Yevonne Pax, MD [facility] Pulmonary Critical Care Medicine Sleep Medicine

## 2020-08-22 ENCOUNTER — Other Ambulatory Visit (HOSPITAL_COMMUNITY): Payer: Medicare Other

## 2020-08-22 DIAGNOSIS — J9621 Acute and chronic respiratory failure with hypoxia: Secondary | ICD-10-CM | POA: Diagnosis not present

## 2020-08-22 DIAGNOSIS — I4891 Unspecified atrial fibrillation: Secondary | ICD-10-CM | POA: Diagnosis not present

## 2020-08-22 DIAGNOSIS — N170 Acute kidney failure with tubular necrosis: Secondary | ICD-10-CM | POA: Diagnosis not present

## 2020-08-22 DIAGNOSIS — U071 COVID-19: Secondary | ICD-10-CM | POA: Diagnosis not present

## 2020-08-22 LAB — CULTURE, BLOOD (ROUTINE X 2)
Culture: NO GROWTH
Culture: NO GROWTH
Special Requests: ADEQUATE

## 2020-08-22 LAB — RENAL FUNCTION PANEL
Albumin: 3.5 g/dL (ref 3.5–5.0)
Anion gap: 13 (ref 5–15)
BUN: 164 mg/dL — ABNORMAL HIGH (ref 8–23)
CO2: 28 mmol/L (ref 22–32)
Calcium: 9 mg/dL (ref 8.9–10.3)
Chloride: 104 mmol/L (ref 98–111)
Creatinine, Ser: 2.27 mg/dL — ABNORMAL HIGH (ref 0.44–1.00)
GFR, Estimated: 20 mL/min — ABNORMAL LOW (ref >60)
Glucose, Bld: 192 mg/dL — ABNORMAL HIGH (ref 70–99)
Phosphorus: 6.3 mg/dL — ABNORMAL HIGH (ref 2.5–4.6)
Potassium: 4.5 mmol/L (ref 3.5–5.1)
Sodium: 145 mmol/L (ref 135–145)

## 2020-08-22 LAB — CBC
HCT: 25.4 % — ABNORMAL LOW (ref 36.0–46.0)
Hemoglobin: 7.8 g/dL — ABNORMAL LOW (ref 12.0–15.0)
MCH: 29.3 pg (ref 26.0–34.0)
MCHC: 30.7 g/dL (ref 30.0–36.0)
MCV: 95.5 fL (ref 80.0–100.0)
Platelets: 198 10*3/uL (ref 150–400)
RBC: 2.66 MIL/uL — ABNORMAL LOW (ref 3.87–5.11)
RDW: 19.9 % — ABNORMAL HIGH (ref 11.5–15.5)
WBC: 13.4 10*3/uL — ABNORMAL HIGH (ref 4.0–10.5)
nRBC: 0.3 % — ABNORMAL HIGH (ref 0.0–0.2)

## 2020-08-22 LAB — MAGNESIUM: Magnesium: 2.2 mg/dL (ref 1.7–2.4)

## 2020-08-22 NOTE — Progress Notes (Signed)
[facility]  ROUNDING NOTE   Subjective:  Patient seen and evaluated at bedside. Remains critically ill at the moment. Urine output was 850 cc over the preceding 24 hours. Remains significantly volume overloaded. Still on the ventilator.   Objective:  Vital signs in last 24 hours:  Temperature 96.7 pulse 98 respirations 20 blood pressure 135/78  Physical Exam: General:  Critically ill-appearing  Head:  Normocephalic, atraumatic.  Dry oral mucosa  Eyes:  Anicteric  Neck:  Tracheostomy in place  Lungs:   Coarse breath sounds bilateral, vent assisted  Heart:  S1S2 no rubs  Abdomen:   Soft, nontender, bowel sounds present  Extremities:  Marked peripheral edema  Neurologic:  Awake, alert, not following commands  Skin:  No acute rashes  Access:  No dialysis access at the moment    Basic Metabolic Panel: Recent Labs  Lab 08/17/20 1018 08/17/20 1018 08/18/20 0554 08/20/20 0618 08/21/20 0359  NA 143  --  146* 147* 146*  K 5.3*  --  4.5 4.3 4.5  CL 105  --  106 105 106  CO2 26  --  28 28 25   GLUCOSE 164*  --  175* 190* 199*  BUN 124*  --  139* 154* 159*  CREATININE 2.13*  --  2.28* 2.09* 2.19*  CALCIUM 8.5*   < > 8.5* 8.8* 8.4*  MG 2.0  --   --   --   --   PHOS 6.6*  --  6.1* 5.8* 5.9*   < > = values in this interval not displayed.    Liver Function Tests: Recent Labs  Lab 08/17/20 1018 08/18/20 0554 08/20/20 0618 08/21/20 0359  AST 25  --   --   --   ALT 48*  --   --   --   ALKPHOS 113  --   --   --   BILITOT 0.4  --   --   --   PROT 5.9*  --   --   --   ALBUMIN 2.4* 2.4* 2.9* 3.1*   No results for input(s): LIPASE, AMYLASE in the last 168 hours. No results for input(s): AMMONIA in the last 168 hours.  CBC: Recent Labs  Lab 08/17/20 1018 08/20/20 0618  WBC 15.4* 11.0*  NEUTROABS 12.9*  --   HGB 8.9* 7.8*  HCT 30.0* 26.5*  MCV 97.4 95.7  PLT 316 258    Cardiac Enzymes: No results for input(s): CKTOTAL, CKMB, CKMBINDEX, TROPONINI in  the last 168 hours.  BNP: Invalid input(s): POCBNP  CBG: No results for input(s): GLUCAP in the last 168 hours.  Microbiology: Results for orders placed or performed during the hospital encounter of 09/15/20  Urine Culture     Status: Abnormal   Collection Time: 08/17/20  1:32 PM   Specimen: Urine, Random  Result Value Ref Range Status   Specimen Description URINE, RANDOM  Final   Special Requests   Final    NONE Performed at [facility], [address] [place], [place]    Culture >=100,000 COLONIES/mL PSEUDOMONAS AERUGINOSA (A)  Final   Report Status 08/19/2020 FINAL  Final   Organism ID, Bacteria PSEUDOMONAS AERUGINOSA (A)  Final      Susceptibility   Pseudomonas aeruginosa - MIC*    CEFTAZIDIME 16 INTERMEDIATE Intermediate     CIPROFLOXACIN <=0.25 SENSITIVE Sensitive     GENTAMICIN <=1 SENSITIVE Sensitive     IMIPENEM 1 SENSITIVE Sensitive     PIP/TAZO 16 SENSITIVE  Sensitive     * >=100,000 COLONIES/mL PSEUDOMONAS AERUGINOSA  Culture, blood (Routine X 2) w Reflex to ID Panel     Status: None   Collection Time: 08/17/20  1:54 PM   Specimen: BLOOD LEFT HAND  Result Value Ref Range Status   Specimen Description BLOOD LEFT HAND  Final   Special Requests   Final    BOTTLES DRAWN AEROBIC AND ANAEROBIC Blood Culture adequate volume   Culture   Final    NO GROWTH 5 DAYS Performed at [facility], [address] [place], [place] [address]    Report Status 08/22/2020 FINAL  Final  Culture, blood (Routine X 2) w Reflex to ID Panel     Status: None   Collection Time: 08/17/20  2:15 PM   Specimen: BLOOD LEFT ARM  Result Value Ref Range Status   Specimen Description BLOOD LEFT ARM  Final   Special Requests   Final    BOTTLES DRAWN AEROBIC ONLY Blood Culture results may not be optimal due to an inadequate volume of blood received in culture bottles   Culture   Final    NO GROWTH 5 DAYS Performed at [facility], [address] [place],  [place] [address]    Report Status 08/22/2020 FINAL  Final  Culture, respiratory (non-expectorated)     Status: None   Collection Time: 08/17/20  6:14 PM   Specimen: Tracheal Aspirate; Respiratory  Result Value Ref Range Status   Specimen Description TRACHEAL ASPIRATE  Final   Special Requests NONE  Final   Gram Stain   Final    RARE WBC PRESENT, PREDOMINANTLY PMN FEW GRAM NEGATIVE RODS Performed at [facility], [address], [place], [place] [address]    Culture ABUNDANT PSEUDOMONAS AERUGINOSA  Final   Report Status 08/20/2020 FINAL  Final   Organism ID, Bacteria PSEUDOMONAS AERUGINOSA  Final      Susceptibility   Pseudomonas aeruginosa - MIC*    CEFTAZIDIME 4 SENSITIVE Sensitive     CIPROFLOXACIN <=0.25 SENSITIVE Sensitive     GENTAMICIN <=1 SENSITIVE Sensitive     IMIPENEM 1 SENSITIVE Sensitive     PIP/TAZO 8 SENSITIVE Sensitive     CEFEPIME 2 SENSITIVE Sensitive     * ABUNDANT PSEUDOMONAS AERUGINOSA    Coagulation Studies: No results for input(s): LABPROT, INR in the last 72 hours.  Urinalysis: No results for input(s): COLORURINE, LABSPEC, PHURINE, GLUCOSEU, HGBUR, BILIRUBINUR, KETONESUR, PROTEINUR, UROBILINOGEN, NITRITE, LEUKOCYTESUR in the last 72 hours.  Invalid input(s): APPERANCEUR    Imaging: DG CHEST PORT 1 VIEW  Result Date: 08/22/2020 CLINICAL DATA:  74 year old female with history of pulmonary edema or pneumonia. EXAM: PORTABLE CHEST 1 VIEW COMPARISON:  Chest x-ray 09-10-2020. FINDINGS: A tracheostomy tube is in place with tip 6.6 cm above the carina. There is a right-sided internal jugular central venous catheter with tip terminating in the right atrium. Lung volumes are normal. Widespread interstitial and airspace opacities are noted throughout the lungs bilaterally (left greater than right), with no significant change in aeration compared to the prior study. Possible trace bilateral pleural effusions. No pneumothorax. Mild cardiomegaly. The patient is rotated  to the right on today's exam, resulting in distortion of the mediastinal contours and reduced diagnostic sensitivity and specificity for mediastinal pathology. Aortic atherosclerosis. IMPRESSION: 1. The appearance the chest is very similar to the prior study, with a spectrum of findings favored to reflect underlying congestive heart failure, although multilobar bilateral pneumonia is difficult  to entirely exclude. 2. Aortic atherosclerosis. Electronically Signed   By: Trudie Reed M.D.   On: 08/22/2020 07:51     Medications:       Assessment/ Plan:  74 y.o. female with a PMHx of recent acute respiratory failure secondary to COVID-19 pneumonia, hypertension, hyperlipidemia, diabetes mellitus type 2, depression, anxiety, obesity, atrial fibrillation, history of MRSA bacteremia and fungal pneumonia, recent acute kidney injury, who was admitted to [facility] on 09/04/2020 for ongoing treatment of acute respiratory failure.   1.  Acute kidney injury.    Patient continues to have severe acute kidney injury.  BUN rising over the weekend and currently 159 with a creatinine of 2.19.  Urine output was only 850 cc.  Recommend initiation of hemodialysis treatment.  We will place orders for the same as the family desires ongoing aggressive care.  2.  Hypernatremia.  We attempted Lasix drip over the weekend however patient developed hypernatremia.  This should be corrected with D5W as well as with dialysis treatment.  3.  Acute respiratory failure.  Maintain the patient on ventilatory support at this time.  4.  Generalized edema/volume overload.  Developed significant hyponatremia on Lasix drip.  This was subsequently stopped.  We will need to plan for dialysis to help remove volume.     LOS: 0 Lenette Rau 10/11/20218:05 AM

## 2020-08-22 NOTE — Progress Notes (Signed)
Pulmonary Critical Care Medicine [facility] [place]   PULMONARY CRITICAL CARE SERVICE  PROGRESS NOTE  Date of Service: 08/22/2020  Aimee Peck  EYC:[id number]  DOB: March 11, 1946   DOA: Sep 06, 2020  Referring Physician: Carron Curie, MD  HPI: Aimee Peck is a 74 y.o. female seen for follow up of Acute on Chronic Respiratory Failure.  Patient currently is on assist control mode has been on 50% FiO2 with a PEEP of 10  Medications: Reviewed on Rounds  Physical Exam:  Vitals: Temperature is 96.7 pulse 98 respiratory rate 28 blood pressure is 135/78 saturations 94%  Ventilator Settings on assist control FiO2 is 50% tidal volume of 460 PEEP 10   General: Comfortable at this time  Eyes: Grossly normal lids, irises & conjunctiva  ENT: grossly tongue is normal  Neck: no obvious mass  Cardiovascular: S1 S2 normal no gallop  Respiratory: Scattered rhonchi very coarse breath sounds  Abdomen: soft  Skin: no rash seen on limited exam  Musculoskeletal: not rigid  Psychiatric:unable to assess  Neurologic: no seizure no involuntary movements         Lab Data:   Basic Metabolic Panel: Recent Labs  Lab 08/17/20 1018 08/18/20 0554 08/20/20 0618 08/21/20 0359  NA 143 146* 147* 146*  K 5.3* 4.5 4.3 4.5  CL 105 106 105 106  CO2 26 28 28 25   GLUCOSE 164* 175* 190* 199*  BUN 124* 139* 154* 159*  CREATININE 2.13* 2.28* 2.09* 2.19*  CALCIUM 8.5* 8.5* 8.8* 8.4*  MG 2.0  --   --   --   PHOS 6.6* 6.1* 5.8* 5.9*    ABG: Recent Labs  Lab 08/17/20 0543 08/17/20 0930 08/17/20 1530 08/17/20 2034 08/18/20 0148  PHART 7.264* 7.245* 7.211* 7.237* 7.335*  PCO2ART 59.8* 60.5* 68.3* 63.5* 54.6*  PO2ART 72.5* 83.9 82.4* 112* 78.3*  HCO3 26.3 25.6 26.2 26.1 28.3*  O2SAT 94.6 96.6 95.5 98.4 96.5    Liver Function Tests: Recent Labs  Lab 08/17/20 1018 08/18/20 0554 08/20/20 0618 08/21/20 0359  AST 25  --   --   --   ALT 48*  --   --   --   ALKPHOS  113  --   --   --   BILITOT 0.4  --   --   --   PROT 5.9*  --   --   --   ALBUMIN 2.4* 2.4* 2.9* 3.1*   No results for input(s): LIPASE, AMYLASE in the last 168 hours. No results for input(s): AMMONIA in the last 168 hours.  CBC: Recent Labs  Lab 08/17/20 1018 08/20/20 0618  WBC 15.4* 11.0*  NEUTROABS 12.9*  --   HGB 8.9* 7.8*  HCT 30.0* 26.5*  MCV 97.4 95.7  PLT 316 258    Cardiac Enzymes: No results for input(s): CKTOTAL, CKMB, CKMBINDEX, TROPONINI in the last 168 hours.  BNP (last 3 results) No results for input(s): BNP in the last 8760 hours.  ProBNP (last 3 results) No results for input(s): PROBNP in the last 8760 hours.  Radiological Exams: DG CHEST PORT 1 VIEW  Result Date: 08/22/2020 CLINICAL DATA:  74 year old female with history of pulmonary edema or pneumonia. EXAM: PORTABLE CHEST 1 VIEW COMPARISON:  Chest x-ray September 06, 2020. FINDINGS: A tracheostomy tube is in place with tip 6.6 cm above the carina. There is a right-sided internal jugular central venous catheter with tip terminating in the right atrium. Lung volumes are normal. Widespread interstitial and airspace opacities are noted throughout the  lungs bilaterally (left greater than right), with no significant change in aeration compared to the prior study. Possible trace bilateral pleural effusions. No pneumothorax. Mild cardiomegaly. The patient is rotated to the right on today's exam, resulting in distortion of the mediastinal contours and reduced diagnostic sensitivity and specificity for mediastinal pathology. Aortic atherosclerosis. IMPRESSION: 1. The appearance the chest is very similar to the prior study, with a spectrum of findings favored to reflect underlying congestive heart failure, although multilobar bilateral pneumonia is difficult to entirely exclude. 2. Aortic atherosclerosis. Electronically Signed   By: Trudie Reed M.D.   On: 08/22/2020 07:51    Assessment/Plan Active Problems:   Acute on  chronic respiratory failure with hypoxia (HCC)   COVID-19 virus infection   Atrial fibrillation with RVR (HCC)   Acute renal failure due to tubular necrosis (HCC)   Metabolic encephalopathy   1. Acute on chronic respiratory failure hypoxia we will continue with full support on the ventilator.  Patient has been checked as far as RSB I is concerned and not able to tolerate. 2. COVID-19 virus infection in recovery we will continue with supportive care. 3. Atrial fibrillation RVR rate is controlled 4. Acute renal failure with tubular necrosis no change 5. Metabolic encephalopathy at baseline   I have personally seen and evaluated the patient, evaluated laboratory and imaging results, formulated the assessment and plan and placed orders. The Patient requires high complexity decision making with multiple systems involvement.  Rounds were done with the Respiratory Therapy Director and Staff therapists and discussed with nursing staff also.  Yevonne Pax, MD [facility] Pulmonary Critical Care Medicine Sleep Medicine

## 2020-08-23 DIAGNOSIS — U071 COVID-19: Secondary | ICD-10-CM | POA: Diagnosis not present

## 2020-08-23 DIAGNOSIS — I4891 Unspecified atrial fibrillation: Secondary | ICD-10-CM | POA: Diagnosis not present

## 2020-08-23 DIAGNOSIS — N170 Acute kidney failure with tubular necrosis: Secondary | ICD-10-CM | POA: Diagnosis not present

## 2020-08-23 DIAGNOSIS — J9621 Acute and chronic respiratory failure with hypoxia: Secondary | ICD-10-CM | POA: Diagnosis not present

## 2020-08-23 LAB — CBC
HCT: 25.1 % — ABNORMAL LOW (ref 36.0–46.0)
Hemoglobin: 7.5 g/dL — ABNORMAL LOW (ref 12.0–15.0)
MCH: 28.8 pg (ref 26.0–34.0)
MCHC: 29.9 g/dL — ABNORMAL LOW (ref 30.0–36.0)
MCV: 96.5 fL (ref 80.0–100.0)
Platelets: 197 10*3/uL (ref 150–400)
RBC: 2.6 MIL/uL — ABNORMAL LOW (ref 3.87–5.11)
RDW: 19.8 % — ABNORMAL HIGH (ref 11.5–15.5)
WBC: 11.7 10*3/uL — ABNORMAL HIGH (ref 4.0–10.5)
nRBC: 0.5 % — ABNORMAL HIGH (ref 0.0–0.2)

## 2020-08-23 LAB — RENAL FUNCTION PANEL
Albumin: 3.3 g/dL — ABNORMAL LOW (ref 3.5–5.0)
Anion gap: 16 — ABNORMAL HIGH (ref 5–15)
BUN: 172 mg/dL — ABNORMAL HIGH (ref 8–23)
CO2: 27 mmol/L (ref 22–32)
Calcium: 9 mg/dL (ref 8.9–10.3)
Chloride: 104 mmol/L (ref 98–111)
Creatinine, Ser: 2.33 mg/dL — ABNORMAL HIGH (ref 0.44–1.00)
GFR, Estimated: 20 mL/min — ABNORMAL LOW (ref >60)
Glucose, Bld: 201 mg/dL — ABNORMAL HIGH (ref 70–99)
Phosphorus: 6.5 mg/dL — ABNORMAL HIGH (ref 2.5–4.6)
Potassium: 4.8 mmol/L (ref 3.5–5.1)
Sodium: 147 mmol/L — ABNORMAL HIGH (ref 135–145)

## 2020-08-23 LAB — MAGNESIUM: Magnesium: 2.3 mg/dL (ref 1.7–2.4)

## 2020-08-23 NOTE — Progress Notes (Addendum)
Pulmonary Critical Care Medicine [facility] [place]   PULMONARY CRITICAL CARE SERVICE  PROGRESS NOTE  Date of Service: 08/23/2020  Aimee Peck  TKZ:[id number]  DOB: 1946/10/18   DOA: 08/26/2020  Referring Physician: Carron Curie, MD  HPI: Aimee Peck is a 74 y.o. female seen for follow up of Acute on Chronic Respiratory Failure.  Patient will continue on full support on the vent at this time currently requiring 50% FiO2 no acute distress noted.  Medications: Reviewed on Rounds  Physical Exam:  Vitals: Pulse 68 respirations 33 BP 142/77 O2 sat 95% temp 97.5  Ventilator Settings ventilator mode AC VC rate of 28 tidal volume 460 PEEP of 10 with an FiO2 of 50%   General: Comfortable at this time  Eyes: Grossly normal lids, irises & conjunctiva  ENT: grossly tongue is normal  Neck: no obvious mass  Cardiovascular: S1 S2 normal no gallop  Respiratory: Coarse breath sounds  Abdomen: soft  Skin: no rash seen on limited exam  Musculoskeletal: not rigid  Psychiatric:unable to assess  Neurologic: no seizure no involuntary movements         Lab Data:   Basic Metabolic Panel: Recent Labs  Lab 08/17/20 1018 08/17/20 1018 08/18/20 0554 08/20/20 0618 08/21/20 0359 08/22/20 1020 08/23/20 0458  NA 143   < > 146* 147* 146* 145 147*  K 5.3*   < > 4.5 4.3 4.5 4.5 4.8  CL 105   < > 106 105 106 104 104  CO2 26   < > 28 28 25 28 27   GLUCOSE 164*   < > 175* 190* 199* 192* 201*  BUN 124*   < > 139* 154* 159* 164* 172*  CREATININE 2.13*   < > 2.28* 2.09* 2.19* 2.27* 2.33*  CALCIUM 8.5*   < > 8.5* 8.8* 8.4* 9.0 9.0  MG 2.0  --   --   --   --  2.2 2.3  PHOS 6.6*   < > 6.1* 5.8* 5.9* 6.3* 6.5*   < > = values in this interval not displayed.    ABG: Recent Labs  Lab 08/17/20 0543 08/17/20 0930 08/17/20 1530 08/17/20 2034 08/18/20 0148  PHART 7.264* 7.245* 7.211* 7.237* 7.335*  PCO2ART 59.8* 60.5* 68.3* 63.5* 54.6*  PO2ART 72.5* 83.9 82.4* 112*  78.3*  HCO3 26.3 25.6 26.2 26.1 28.3*  O2SAT 94.6 96.6 95.5 98.4 96.5    Liver Function Tests: Recent Labs  Lab 08/17/20 1018 08/17/20 1018 08/18/20 0554 08/20/20 0618 08/21/20 0359 08/22/20 1020 08/23/20 0458  AST 25  --   --   --   --   --   --   ALT 48*  --   --   --   --   --   --   ALKPHOS 113  --   --   --   --   --   --   BILITOT 0.4  --   --   --   --   --   --   PROT 5.9*  --   --   --   --   --   --   ALBUMIN 2.4*   < > 2.4* 2.9* 3.1* 3.5 3.3*   < > = values in this interval not displayed.   No results for input(s): LIPASE, AMYLASE in the last 168 hours. No results for input(s): AMMONIA in the last 168 hours.  CBC: Recent Labs  Lab 08/17/20 1018 08/20/20 0618 08/22/20 1020 08/23/20 0458  WBC 15.4* 11.0* 13.4* 11.7*  NEUTROABS 12.9*  --   --   --   HGB 8.9* 7.8* 7.8* 7.5*  HCT 30.0* 26.5* 25.4* 25.1*  MCV 97.4 95.7 95.5 96.5  PLT 316 258 198 197    Cardiac Enzymes: No results for input(s): CKTOTAL, CKMB, CKMBINDEX, TROPONINI in the last 168 hours.  BNP (last 3 results) No results for input(s): BNP in the last 8760 hours.  ProBNP (last 3 results) No results for input(s): PROBNP in the last 8760 hours.  Radiological Exams: DG CHEST PORT 1 VIEW  Result Date: 08/22/2020 CLINICAL DATA:  74 year old female with history of pulmonary edema or pneumonia. EXAM: PORTABLE CHEST 1 VIEW COMPARISON:  Chest x-ray 08/30/2020. FINDINGS: A tracheostomy tube is in place with tip 6.6 cm above the carina. There is a right-sided internal jugular central venous catheter with tip terminating in the right atrium. Lung volumes are normal. Widespread interstitial and airspace opacities are noted throughout the lungs bilaterally (left greater than right), with no significant change in aeration compared to the prior study. Possible trace bilateral pleural effusions. No pneumothorax. Mild cardiomegaly. The patient is rotated to the right on today's exam, resulting in distortion of the  mediastinal contours and reduced diagnostic sensitivity and specificity for mediastinal pathology. Aortic atherosclerosis. IMPRESSION: 1. The appearance the chest is very similar to the prior study, with a spectrum of findings favored to reflect underlying congestive heart failure, although multilobar bilateral pneumonia is difficult to entirely exclude. 2. Aortic atherosclerosis. Electronically Signed   By: Trudie Reed M.D.   On: 08/22/2020 07:51    Assessment/Plan Active Problems:   Acute on chronic respiratory failure with hypoxia (HCC)   COVID-19 virus infection   Atrial fibrillation with RVR (HCC)   Acute renal failure due to tubular necrosis (HCC)   Metabolic encephalopathy   1. Acute on chronic respiratory failure hypoxia patient continues on full support currently requiring a PEEP of 10 with an FiO2 of 50% we will continue to wean as tolerated.  Continue aggressive pulmonary toilet supportive measures. 2. COVID-19 virus infection in recovery we will continue with supportive care. 3. Atrial fibrillation RVR rate is controlled 4. Acute renal failure with tubular necrosis no change 5. Metabolic encephalopathy at baseline   I have personally seen and evaluated the patient, evaluated laboratory and imaging results, formulated the assessment and plan and placed orders. The Patient requires high complexity decision making with multiple systems involvement.  Rounds were done with the Respiratory Therapy Director and Staff therapists and discussed with nursing staff also.  Yevonne Pax, MD [facility] Pulmonary Critical Care Medicine Sleep Medicine

## 2020-08-24 ENCOUNTER — Other Ambulatory Visit (HOSPITAL_COMMUNITY): Payer: Medicare Other

## 2020-08-24 DIAGNOSIS — I4891 Unspecified atrial fibrillation: Secondary | ICD-10-CM | POA: Diagnosis not present

## 2020-08-24 DIAGNOSIS — N170 Acute kidney failure with tubular necrosis: Secondary | ICD-10-CM | POA: Diagnosis not present

## 2020-08-24 DIAGNOSIS — J9621 Acute and chronic respiratory failure with hypoxia: Secondary | ICD-10-CM | POA: Diagnosis not present

## 2020-08-24 DIAGNOSIS — U071 COVID-19: Secondary | ICD-10-CM | POA: Diagnosis not present

## 2020-08-24 HISTORY — PX: IR FLUORO GUIDE CV LINE RIGHT: IMG2283

## 2020-08-24 HISTORY — PX: IR US GUIDE VASC ACCESS RIGHT: IMG2390

## 2020-08-24 LAB — RENAL FUNCTION PANEL
Albumin: 3 g/dL — ABNORMAL LOW (ref 3.5–5.0)
Anion gap: 13 (ref 5–15)
BUN: 182 mg/dL — ABNORMAL HIGH (ref 8–23)
CO2: 28 mmol/L (ref 22–32)
Calcium: 9 mg/dL (ref 8.9–10.3)
Chloride: 103 mmol/L (ref 98–111)
Creatinine, Ser: 2.31 mg/dL — ABNORMAL HIGH (ref 0.44–1.00)
GFR, Estimated: 20 mL/min — ABNORMAL LOW (ref >60)
Glucose, Bld: 180 mg/dL — ABNORMAL HIGH (ref 70–99)
Phosphorus: 6.6 mg/dL — ABNORMAL HIGH (ref 2.5–4.6)
Potassium: 4.9 mmol/L (ref 3.5–5.1)
Sodium: 144 mmol/L (ref 135–145)

## 2020-08-24 LAB — CBC
HCT: 25 % — ABNORMAL LOW (ref 36.0–46.0)
Hemoglobin: 7.3 g/dL — ABNORMAL LOW (ref 12.0–15.0)
MCH: 28.2 pg (ref 26.0–34.0)
MCHC: 29.2 g/dL — ABNORMAL LOW (ref 30.0–36.0)
MCV: 96.5 fL (ref 80.0–100.0)
Platelets: 166 10*3/uL (ref 150–400)
RBC: 2.59 MIL/uL — ABNORMAL LOW (ref 3.87–5.11)
RDW: 19.2 % — ABNORMAL HIGH (ref 11.5–15.5)
WBC: 11 10*3/uL — ABNORMAL HIGH (ref 4.0–10.5)
nRBC: 0.8 % — ABNORMAL HIGH (ref 0.0–0.2)

## 2020-08-24 LAB — BLOOD GAS, ARTERIAL
Acid-Base Excess: 2.5 mmol/L — ABNORMAL HIGH (ref 0.0–2.0)
Bicarbonate: 27.7 mmol/L (ref 20.0–28.0)
FIO2: 45
O2 Saturation: 94.9 %
Patient temperature: 37
pCO2 arterial: 52.5 mmHg — ABNORMAL HIGH (ref 32.0–48.0)
pH, Arterial: 7.343 — ABNORMAL LOW (ref 7.350–7.450)
pO2, Arterial: 71.4 mmHg — ABNORMAL LOW (ref 83.0–108.0)

## 2020-08-24 LAB — MAGNESIUM: Magnesium: 2.3 mg/dL (ref 1.7–2.4)

## 2020-08-24 LAB — HEPATITIS B CORE ANTIBODY, TOTAL: Hep B Core Total Ab: NONREACTIVE

## 2020-08-24 LAB — HEPATITIS B SURFACE ANTIBODY,QUALITATIVE: Hep B S Ab: NONREACTIVE

## 2020-08-24 LAB — HEPATITIS B SURFACE ANTIGEN: Hepatitis B Surface Ag: NONREACTIVE

## 2020-08-24 LAB — LACTIC ACID, PLASMA: Lactic Acid, Venous: 1.5 mmol/L (ref 0.5–1.9)

## 2020-08-24 MED ORDER — HEPARIN SODIUM (PORCINE) 1000 UNIT/ML IJ SOLN
INTRAMUSCULAR | Status: AC | PRN
  Administered 2020-08-24: 1000 [IU]

## 2020-08-24 MED ORDER — LIDOCAINE HCL 1 % IJ SOLN
INTRAMUSCULAR | Status: AC | PRN
  Administered 2020-08-24: 20 mL via INTRADERMAL

## 2020-08-24 MED ORDER — LIDOCAINE HCL 1 % IJ SOLN
INTRAMUSCULAR | Status: AC
  Filled 2020-08-24: qty 20

## 2020-08-24 MED ORDER — HEPARIN SODIUM (PORCINE) 1000 UNIT/ML IJ SOLN
INTRAMUSCULAR | Status: AC
  Filled 2020-08-24: qty 1

## 2020-08-24 NOTE — Procedures (Signed)
Interventional Radiology Procedure Note  Procedure: Right IJ non-tunneled HD catheter  Complications: None  Estimated Blood Loss: < 10 mL  Findings: 20 cm, triple lumen Mahurkar catheter placed via right IJ with tip in RA. OK to use.  Jodi Marble. Fredia Sorrow, M.D Pager:  [phone]

## 2020-08-24 NOTE — Progress Notes (Signed)
[facility]  ROUNDING NOTE   Subjective:  Patient has not yet had temporary dialysis catheter placed. This is to be placed today. Thereafter she will have her first dialysis treatment.   Objective:  Vital signs in last 24 hours:  Temperature 97 pulse 62 respirations 20 blood pressure 125/70  Physical Exam: General:  Critically ill-appearing  Head:  Normocephalic, atraumatic.  Dry oral mucosa  Eyes:  Anicteric  Neck:  Tracheostomy in place  Lungs:   Coarse breath sounds bilateral, vent assisted  Heart:  S1S2 no rubs  Abdomen:   Soft, nontender, bowel sounds present  Extremities:  Marked peripheral edema  Neurologic:  Awake, alert, not following commands  Skin:  No acute rashes  Access:  No dialysis access at the moment    Basic Metabolic Panel: Recent Labs  Lab 08/20/20 0618 08/20/20 0618 08/21/20 0359 08/21/20 0359 08/22/20 1020 08/23/20 0458 08/24/20 0602  NA 147*  --  146*  --  145 147* 144  K 4.3  --  4.5  --  4.5 4.8 4.9  CL 105  --  106  --  104 104 103  CO2 28  --  25  --  28 27 28   GLUCOSE 190*  --  199*  --  192* 201* 180*  BUN 154*  --  159*  --  164* 172* 182*  CREATININE 2.09*  --  2.19*  --  2.27* 2.33* 2.31*  CALCIUM 8.8*   < > 8.4*   < > 9.0 9.0 9.0  MG  --   --   --   --  2.2 2.3 2.3  PHOS 5.8*  --  5.9*  --  6.3* 6.5* 6.6*   < > = values in this interval not displayed.    Liver Function Tests: Recent Labs  Lab 08/20/20 0618 08/21/20 0359 08/22/20 1020 08/23/20 0458 08/24/20 0602  ALBUMIN 2.9* 3.1* 3.5 3.3* 3.0*   No results for input(s): LIPASE, AMYLASE in the last 168 hours. No results for input(s): AMMONIA in the last 168 hours.  CBC: Recent Labs  Lab 08/20/20 0618 08/22/20 1020 08/23/20 0458 08/24/20 0602  WBC 11.0* 13.4* 11.7* 11.0*  HGB 7.8* 7.8* 7.5* 7.3*  HCT 26.5* 25.4* 25.1* 25.0*  MCV 95.7 95.5 96.5 96.5  PLT 258 198 197 166    Cardiac Enzymes: No results for input(s): CKTOTAL, CKMB, CKMBINDEX,  TROPONINI in the last 168 hours.  BNP: Invalid input(s): POCBNP  CBG: No results for input(s): GLUCAP in the last 168 hours.  Microbiology: Results for orders placed or performed during the hospital encounter of September 09, 2020  Urine Culture     Status: Abnormal   Collection Time: 08/17/20  1:32 PM   Specimen: Urine, Random  Result Value Ref Range Status   Specimen Description URINE, RANDOM  Final   Special Requests   Final    NONE Performed at [facility], [address] [place], [place]    Culture >=100,000 COLONIES/mL PSEUDOMONAS AERUGINOSA (A)  Final   Report Status 08/19/2020 FINAL  Final   Organism ID, Bacteria PSEUDOMONAS AERUGINOSA (A)  Final      Susceptibility   Pseudomonas aeruginosa - MIC*    CEFTAZIDIME 16 INTERMEDIATE Intermediate     CIPROFLOXACIN <=0.25 SENSITIVE Sensitive     GENTAMICIN <=1 SENSITIVE Sensitive     IMIPENEM 1 SENSITIVE Sensitive     PIP/TAZO 16 SENSITIVE Sensitive     * >=100,000 COLONIES/mL PSEUDOMONAS AERUGINOSA  Culture, blood (  Routine X 2) w Reflex to ID Panel     Status: None   Collection Time: 08/17/20  1:54 PM   Specimen: BLOOD LEFT HAND  Result Value Ref Range Status   Specimen Description BLOOD LEFT HAND  Final   Special Requests   Final    BOTTLES DRAWN AEROBIC AND ANAEROBIC Blood Culture adequate volume   Culture   Final    NO GROWTH 5 DAYS Performed at [facility], [address], [place], [place] [address]    Report Status 08/22/2020 FINAL  Final  Culture, blood (Routine X 2) w Reflex to ID Panel     Status: None   Collection Time: 08/17/20  2:15 PM   Specimen: BLOOD LEFT ARM  Result Value Ref Range Status   Specimen Description BLOOD LEFT ARM  Final   Special Requests   Final    BOTTLES DRAWN AEROBIC ONLY Blood Culture results may not be optimal due to an inadequate volume of blood received in culture bottles   Culture   Final    NO GROWTH 5 DAYS Performed at [facility], [address],  [place], [place] [address]    Report Status 08/22/2020 FINAL  Final  Culture, respiratory (non-expectorated)     Status: None   Collection Time: 08/17/20  6:14 PM   Specimen: Tracheal Aspirate; Respiratory  Result Value Ref Range Status   Specimen Description TRACHEAL ASPIRATE  Final   Special Requests NONE  Final   Gram Stain   Final    RARE WBC PRESENT, PREDOMINANTLY PMN FEW GRAM NEGATIVE RODS Performed at [facility], [address] [place], [place] [address]    Culture ABUNDANT PSEUDOMONAS AERUGINOSA  Final   Report Status 08/20/2020 FINAL  Final   Organism ID, Bacteria PSEUDOMONAS AERUGINOSA  Final      Susceptibility   Pseudomonas aeruginosa - MIC*    CEFTAZIDIME 4 SENSITIVE Sensitive     CIPROFLOXACIN <=0.25 SENSITIVE Sensitive     GENTAMICIN <=1 SENSITIVE Sensitive     IMIPENEM 1 SENSITIVE Sensitive     PIP/TAZO 8 SENSITIVE Sensitive     CEFEPIME 2 SENSITIVE Sensitive     * ABUNDANT PSEUDOMONAS AERUGINOSA    Coagulation Studies: No results for input(s): LABPROT, INR in the last 72 hours.  Urinalysis: No results for input(s): COLORURINE, LABSPEC, PHURINE, GLUCOSEU, HGBUR, BILIRUBINUR, KETONESUR, PROTEINUR, UROBILINOGEN, NITRITE, LEUKOCYTESUR in the last 72 hours.  Invalid input(s): APPERANCEUR    Imaging: DG CHEST PORT 1 VIEW  Result Date: 08/24/2020 CLINICAL DATA:  Pneumonia. EXAM: PORTABLE CHEST 1 VIEW COMPARISON:  08/22/2020 FINDINGS: 0528 hours. The cardio pericardial silhouette is enlarged. Tracheostomy tube remains in place. Right IJ central line tip overlies the right atrium. The diffuse interstitial and airspace opacity is similar to prior. No substantial pleural effusion. Bones are demineralized. Telemetry leads overlie the chest. IMPRESSION: No substantial interval change in exam. Diffuse interstitial and airspace disease. Electronically Signed   By: Kennith Center M.D.   On: 08/24/2020 06:00     Medications:       Assessment/ Plan:  74 y.o.  female with a PMHx of recent acute respiratory failure secondary to COVID-19 pneumonia, hypertension, hyperlipidemia, diabetes mellitus type 2, depression, anxiety, obesity, atrial fibrillation, history of MRSA bacteremia and fungal pneumonia, recent acute kidney injury, who was admitted to [facility] on 08-31-20 for ongoing treatment of acute respiratory failure.   1.  Acute kidney injury.    Temporary dialysis catheter not placed  yet.  Continues to have worsening azotemia with BUN up to 182 and a creatinine of 2.3.  We are awaiting temporary dialysis catheter placement and we will start her first dialysis treatment today and thereafter her second treatment on Friday.  2.  Hypernatremia.  Improved.  Serum sodium down to 144.  Continue to monitor.  3.  Acute respiratory failure.  Patient maintained on vent support.  Weaning as per pulmonary/critical care.  4.  Generalized edema/volume overload.  We will initiate ultrafiltration with second dialysis treatment.  Did not tolerate Lasix drip well as hyponatremia worsened.     LOS: 0 Gunter Conde 10/13/202110:37 AM

## 2020-08-24 NOTE — Progress Notes (Addendum)
Pulmonary Critical Care Medicine [facility] [place]   PULMONARY CRITICAL CARE SERVICE  PROGRESS NOTE  Date of Service: 08/24/2020  Aimee Peck  ZOX:[id number]  DOB: 1946-05-08   DOA: 08-31-20  Referring Physician: Carron Curie, MD  HPI: Aimee Peck is a 74 y.o. female seen for follow up of Acute on Chronic Respiratory Failure.  Patient remains on full support on the vent at this time currently requiring 45% FiO2.  Satting well currently.  Medications: Reviewed on Rounds  Physical Exam:  Vitals: Pulse 62 respirations 20 BP 125/70 O2 sat 96% temp 97.0  Ventilator Settings ventilator mode AC VC plus rate of 28 tidal volume 460 PEEP of 10 with an FiO2 of 45%  . General: Comfortable at this time . Eyes: Grossly normal lids, irises & conjunctiva . ENT: grossly tongue is normal . Neck: no obvious mass . Cardiovascular: S1 S2 normal no gallop . Respiratory: Coarse breath sounds . Abdomen: soft . Skin: no rash seen on limited exam . Musculoskeletal: not rigid . Psychiatric:unable to assess . Neurologic: no seizure no involuntary movements         Lab Data:   Basic Metabolic Panel: Recent Labs  Lab 08/20/20 0618 08/21/20 0359 08/22/20 1020 08/23/20 0458 08/24/20 0602  NA 147* 146* 145 147* 144  K 4.3 4.5 4.5 4.8 4.9  CL 105 106 104 104 103  CO2 28 25 28 27 28   GLUCOSE 190* 199* 192* 201* 180*  BUN 154* 159* 164* 172* 182*  CREATININE 2.09* 2.19* 2.27* 2.33* 2.31*  CALCIUM 8.8* 8.4* 9.0 9.0 9.0  MG  --   --  2.2 2.3 2.3  PHOS 5.8* 5.9* 6.3* 6.5* 6.6*    ABG: Recent Labs  Lab 08/17/20 1530 08/17/20 2034 08/18/20 0148 08/24/20 0530  PHART 7.211* 7.237* 7.335* 7.343*  PCO2ART 68.3* 63.5* 54.6* 52.5*  PO2ART 82.4* 112* 78.3* 71.4*  HCO3 26.2 26.1 28.3* 27.7  O2SAT 95.5 98.4 96.5 94.9    Liver Function Tests: Recent Labs  Lab 08/20/20 0618 08/21/20 0359 08/22/20 1020 08/23/20 0458 08/24/20 0602  ALBUMIN 2.9* 3.1* 3.5 3.3* 3.0*    No results for input(s): LIPASE, AMYLASE in the last 168 hours. No results for input(s): AMMONIA in the last 168 hours.  CBC: Recent Labs  Lab 08/20/20 0618 08/22/20 1020 08/23/20 0458 08/24/20 0602  WBC 11.0* 13.4* 11.7* 11.0*  HGB 7.8* 7.8* 7.5* 7.3*  HCT 26.5* 25.4* 25.1* 25.0*  MCV 95.7 95.5 96.5 96.5  PLT 258 198 197 166    Cardiac Enzymes: No results for input(s): CKTOTAL, CKMB, CKMBINDEX, TROPONINI in the last 168 hours.  BNP (last 3 results) No results for input(s): BNP in the last 8760 hours.  ProBNP (last 3 results) No results for input(s): PROBNP in the last 8760 hours.  Radiological Exams: DG CHEST PORT 1 VIEW  Result Date: 08/24/2020 CLINICAL DATA:  Pneumonia. EXAM: PORTABLE CHEST 1 VIEW COMPARISON:  08/22/2020 FINDINGS: 0528 hours. The cardio pericardial silhouette is enlarged. Tracheostomy tube remains in place. Right IJ central line tip overlies the right atrium. The diffuse interstitial and airspace opacity is similar to prior. No substantial pleural effusion. Bones are demineralized. Telemetry leads overlie the chest. IMPRESSION: No substantial interval change in exam. Diffuse interstitial and airspace disease. Electronically Signed   By: 10/22/2020 M.D.   On: 08/24/2020 06:00    Assessment/Plan Active Problems:   Acute on chronic respiratory failure with hypoxia (HCC)   COVID-19 virus infection   Atrial fibrillation  with RVR (HCC)   Acute renal failure due to tubular necrosis (HCC)   Metabolic encephalopathy   1. Acute on chronic respiratory failure hypoxia  patient will continue on full support at this time continue to wean PEEP and FiO2 as tolerated.  Continue aggressive pulmonary toilet supportive measures. 2. COVID-19 virus infection in recovery we will continue with supportive care. 3. Atrial fibrillation RVR rate is controlled 4. Acute renal failure with tubular necrosis no change 5. Metabolic encephalopathy at baseline   I have  personally seen and evaluated the patient, evaluated laboratory and imaging results, formulated the assessment and plan and placed orders. The Patient requires high complexity decision making with multiple systems involvement.  Rounds were done with the Respiratory Therapy Director and Staff therapists and discussed with nursing staff also.  Yevonne Pax, MD [facility] Pulmonary Critical Care Medicine Sleep Medicine

## 2020-08-25 DIAGNOSIS — J9621 Acute and chronic respiratory failure with hypoxia: Secondary | ICD-10-CM | POA: Diagnosis not present

## 2020-08-25 DIAGNOSIS — N170 Acute kidney failure with tubular necrosis: Secondary | ICD-10-CM | POA: Diagnosis not present

## 2020-08-25 DIAGNOSIS — I4891 Unspecified atrial fibrillation: Secondary | ICD-10-CM | POA: Diagnosis not present

## 2020-08-25 DIAGNOSIS — U071 COVID-19: Secondary | ICD-10-CM | POA: Diagnosis not present

## 2020-08-25 LAB — CBC
HCT: 21.9 % — ABNORMAL LOW (ref 36.0–46.0)
Hemoglobin: 6.5 g/dL — CL (ref 12.0–15.0)
MCH: 28.3 pg (ref 26.0–34.0)
MCHC: 29.7 g/dL — ABNORMAL LOW (ref 30.0–36.0)
MCV: 95.2 fL (ref 80.0–100.0)
Platelets: 147 10*3/uL — ABNORMAL LOW (ref 150–400)
RBC: 2.3 MIL/uL — ABNORMAL LOW (ref 3.87–5.11)
RDW: 19.5 % — ABNORMAL HIGH (ref 11.5–15.5)
WBC: 10.5 10*3/uL (ref 4.0–10.5)
nRBC: 0.5 % — ABNORMAL HIGH (ref 0.0–0.2)

## 2020-08-25 LAB — RENAL FUNCTION PANEL
Albumin: 3 g/dL — ABNORMAL LOW (ref 3.5–5.0)
Anion gap: 14 (ref 5–15)
BUN: 159 mg/dL — ABNORMAL HIGH (ref 8–23)
CO2: 26 mmol/L (ref 22–32)
Calcium: 8.8 mg/dL — ABNORMAL LOW (ref 8.9–10.3)
Chloride: 101 mmol/L (ref 98–111)
Creatinine, Ser: 2.03 mg/dL — ABNORMAL HIGH (ref 0.44–1.00)
GFR, Estimated: 23 mL/min — ABNORMAL LOW (ref >60)
Glucose, Bld: 205 mg/dL — ABNORMAL HIGH (ref 70–99)
Phosphorus: 5.9 mg/dL — ABNORMAL HIGH (ref 2.5–4.6)
Potassium: 4.7 mmol/L (ref 3.5–5.1)
Sodium: 141 mmol/L (ref 135–145)

## 2020-08-25 LAB — ABO/RH: ABO/RH(D): O POS

## 2020-08-25 LAB — PREPARE RBC (CROSSMATCH)

## 2020-08-25 LAB — MAGNESIUM: Magnesium: 2.2 mg/dL (ref 1.7–2.4)

## 2020-08-25 NOTE — Progress Notes (Addendum)
Pulmonary Critical Care Medicine [facility] [place]   PULMONARY CRITICAL CARE SERVICE  PROGRESS NOTE  Date of Service: 08/25/2020  Aimee Peck  LGX:[id number]  DOB: August 31, 1946   DOA: 08-21-20  Referring Physician: Carron Curie, MD  HPI: Aimee Peck is a 74 y.o. female seen for follow up of Acute on Chronic Respiratory Failure.  Patient continues on full support on the ventilator currently requiring 45% FiO2.  No distress at this time.  Medications: Reviewed on Rounds  Physical Exam:  Vitals: Pulse 66 respirations 29 BP 115/59 O2 sat 96% temp 96.7  Ventilator Settings ventilator mode AC VC plus rate of 28 tidal volume 460 PEEP of 10 with an FiO2 of 45%  . General: Comfortable at this time . Eyes: Grossly normal lids, irises & conjunctiva . ENT: grossly tongue is normal . Neck: no obvious mass . Cardiovascular: S1 S2 normal no gallop . Respiratory: No rales or rhonchi noted . Abdomen: soft . Skin: no rash seen on limited exam . Musculoskeletal: not rigid . Psychiatric:unable to assess . Neurologic: no seizure no involuntary movements         Lab Data:   Basic Metabolic Panel: Recent Labs  Lab 08/21/20 0359 08/22/20 1020 08/23/20 0458 08/24/20 0602 08/25/20 0552  NA 146* 145 147* 144 141  K 4.5 4.5 4.8 4.9 4.7  CL 106 104 104 103 101  CO2 25 28 27 28 26   GLUCOSE 199* 192* 201* 180* 205*  BUN 159* 164* 172* 182* 159*  CREATININE 2.19* 2.27* 2.33* 2.31* 2.03*  CALCIUM 8.4* 9.0 9.0 9.0 8.8*  MG  --  2.2 2.3 2.3 2.2  PHOS 5.9* 6.3* 6.5* 6.6* 5.9*    ABG: Recent Labs  Lab 08/24/20 0530  PHART 7.343*  PCO2ART 52.5*  PO2ART 71.4*  HCO3 27.7  O2SAT 94.9    Liver Function Tests: Recent Labs  Lab 08/21/20 0359 08/22/20 1020 08/23/20 0458 08/24/20 0602 08/25/20 0552  ALBUMIN 3.1* 3.5 3.3* 3.0* 3.0*   No results for input(s): LIPASE, AMYLASE in the last 168 hours. No results for input(s): AMMONIA in the last 168  hours.  CBC: Recent Labs  Lab 08/20/20 0618 08/22/20 1020 08/23/20 0458 08/24/20 0602 08/25/20 0552  WBC 11.0* 13.4* 11.7* 11.0* 10.5  HGB 7.8* 7.8* 7.5* 7.3* 6.5*  HCT 26.5* 25.4* 25.1* 25.0* 21.9*  MCV 95.7 95.5 96.5 96.5 95.2  PLT 258 198 197 166 147*    Cardiac Enzymes: No results for input(s): CKTOTAL, CKMB, CKMBINDEX, TROPONINI in the last 168 hours.  BNP (last 3 results) No results for input(s): BNP in the last 8760 hours.  ProBNP (last 3 results) No results for input(s): PROBNP in the last 8760 hours.  Radiological Exams: IR Fluoro Guide CV Line Right  Result Date: 08/24/2020 CLINICAL DATA:  Renal failure and need for non tunneled hemodialysis catheter placement. EXAM: NON-TUNNELED CENTRAL VENOUS HEMODIALYSIS CATHETER PLACEMENT WITH ULTRASOUND AND FLUOROSCOPIC GUIDANCE FLUOROSCOPY TIME:  24 seconds.  2.0 mGy. PROCEDURE: The procedure, risks, benefits, and alternatives were explained to the patient's daughter. Questions regarding the procedure were encouraged and answered. The patient's daughter understands and consents to the procedure. A time-out was performed prior to initiating the procedure. The right neck and chest were prepped with chlorhexidine in a sterile fashion, and a sterile drape was applied covering the operative field. Maximum barrier sterile technique with sterile gowns and gloves were used for the procedure. Local anesthesia was provided with 1% lidocaine. After creating a small venotomy incision,  a 21 gauge needle was advanced into the right internal jugular vein under direct, real-time ultrasound guidance. Ultrasound image documentation was performed. After securing guidewire access the venotomy was dilated and a 13 [place], triple lumen, 20 cm Mahurkar catheter advanced over the wire. Final catheter positioning was confirmed and documented with a fluoroscopic spot image. The catheter was aspirated, flushed with saline, and injected with appropriate volume  heparin dwells. The catheter exit site was secured with 0-Prolene retention sutures. COMPLICATIONS: None.  No pneumothorax. FINDINGS: After catheter placement, the tip lies in the right atrium. The catheter aspirates normally and is ready for immediate use. IMPRESSION: Placement of non-tunneled central venous hemodialysis catheter via the right internal jugular vein. The catheter tip lies in the right atrium. The catheter is ready for immediate use. Electronically Signed   By: Irish Lack M.D.   On: 08/24/2020 14:51   IR US Guide Vasc Access Right  Result Date: 08/24/2020 CLINICAL DATA:  Renal failure and need for non tunneled hemodialysis catheter placement. EXAM: NON-TUNNELED CENTRAL VENOUS HEMODIALYSIS CATHETER PLACEMENT WITH ULTRASOUND AND FLUOROSCOPIC GUIDANCE FLUOROSCOPY TIME:  24 seconds.  2.0 mGy. PROCEDURE: The procedure, risks, benefits, and alternatives were explained to the patient's daughter. Questions regarding the procedure were encouraged and answered. The patient's daughter understands and consents to the procedure. A time-out was performed prior to initiating the procedure. The right neck and chest were prepped with chlorhexidine in a sterile fashion, and a sterile drape was applied covering the operative field. Maximum barrier sterile technique with sterile gowns and gloves were used for the procedure. Local anesthesia was provided with 1% lidocaine. After creating a small venotomy incision, a 21 gauge needle was advanced into the right internal jugular vein under direct, real-time ultrasound guidance. Ultrasound image documentation was performed. After securing guidewire access the venotomy was dilated and a 13 [place], triple lumen, 20 cm Mahurkar catheter advanced over the wire. Final catheter positioning was confirmed and documented with a fluoroscopic spot image. The catheter was aspirated, flushed with saline, and injected with appropriate volume heparin dwells. The catheter exit  site was secured with 0-Prolene retention sutures. COMPLICATIONS: None.  No pneumothorax. FINDINGS: After catheter placement, the tip lies in the right atrium. The catheter aspirates normally and is ready for immediate use. IMPRESSION: Placement of non-tunneled central venous hemodialysis catheter via the right internal jugular vein. The catheter tip lies in the right atrium. The catheter is ready for immediate use. Electronically Signed   By: Irish Lack M.D.   On: 08/24/2020 14:51   DG CHEST PORT 1 VIEW  Result Date: 08/24/2020 CLINICAL DATA:  Pneumonia. EXAM: PORTABLE CHEST 1 VIEW COMPARISON:  08/22/2020 FINDINGS: 0528 hours. The cardio pericardial silhouette is enlarged. Tracheostomy tube remains in place. Right IJ central line tip overlies the right atrium. The diffuse interstitial and airspace opacity is similar to prior. No substantial pleural effusion. Bones are demineralized. Telemetry leads overlie the chest. IMPRESSION: No substantial interval change in exam. Diffuse interstitial and airspace disease. Electronically Signed   By: Kennith Center M.D.   On: 08/24/2020 06:00    Assessment/Plan Active Problems:   Acute on chronic respiratory failure with hypoxia (HCC)   COVID-19 virus infection   Atrial fibrillation with RVR (HCC)   Acute renal failure due to tubular necrosis (HCC)   Metabolic encephalopathy   1. Acute on chronic respiratory failure hypoxiapatient will continue on full support on the vent at this time.  We will continue to attempt weaning as tolerated.  Continue aggressive pulmonary toilet supportive measures. 2. COVID-19 virus infection in recovery we will continue with supportive care. 3. Atrial fibrillation RVR rate is controlled 4. Acute renal failure with tubular necrosis no change 5. Metabolic encephalopathy at baseline   I have personally seen and evaluated the patient, evaluated laboratory and imaging results, formulated the assessment and plan and placed  orders. The Patient requires high complexity decision making with multiple systems involvement.  Rounds were done with the Respiratory Therapy Director and Staff therapists and discussed with nursing staff also.  Yevonne Pax, MD [facility] Pulmonary Critical Care Medicine Sleep Medicine

## 2020-08-26 DIAGNOSIS — J9621 Acute and chronic respiratory failure with hypoxia: Secondary | ICD-10-CM | POA: Diagnosis not present

## 2020-08-26 DIAGNOSIS — I4891 Unspecified atrial fibrillation: Secondary | ICD-10-CM | POA: Diagnosis not present

## 2020-08-26 DIAGNOSIS — N170 Acute kidney failure with tubular necrosis: Secondary | ICD-10-CM | POA: Diagnosis not present

## 2020-08-26 DIAGNOSIS — U071 COVID-19: Secondary | ICD-10-CM | POA: Diagnosis not present

## 2020-08-26 LAB — BPAM RBC
Blood Product Expiration Date: 202111182359
ISSUE DATE / TIME: 202110141118
Unit Type and Rh: 5100

## 2020-08-26 LAB — RENAL FUNCTION PANEL
Albumin: 3 g/dL — ABNORMAL LOW (ref 3.5–5.0)
Anion gap: 12 (ref 5–15)
BUN: 159 mg/dL — ABNORMAL HIGH (ref 8–23)
CO2: 28 mmol/L (ref 22–32)
Calcium: 8.9 mg/dL (ref 8.9–10.3)
Chloride: 99 mmol/L (ref 98–111)
Creatinine, Ser: 2.04 mg/dL — ABNORMAL HIGH (ref 0.44–1.00)
GFR, Estimated: 23 mL/min — ABNORMAL LOW (ref >60)
Glucose, Bld: 195 mg/dL — ABNORMAL HIGH (ref 70–99)
Phosphorus: 6.3 mg/dL — ABNORMAL HIGH (ref 2.5–4.6)
Potassium: 5 mmol/L (ref 3.5–5.1)
Sodium: 139 mmol/L (ref 135–145)

## 2020-08-26 LAB — TYPE AND SCREEN
ABO/RH(D): O POS
Antibody Screen: NEGATIVE
Unit division: 0

## 2020-08-26 LAB — CBC
HCT: 27.1 % — ABNORMAL LOW (ref 36.0–46.0)
Hemoglobin: 8.3 g/dL — ABNORMAL LOW (ref 12.0–15.0)
MCH: 28.9 pg (ref 26.0–34.0)
MCHC: 30.6 g/dL (ref 30.0–36.0)
MCV: 94.4 fL (ref 80.0–100.0)
Platelets: 144 10*3/uL — ABNORMAL LOW (ref 150–400)
RBC: 2.87 MIL/uL — ABNORMAL LOW (ref 3.87–5.11)
RDW: 18.8 % — ABNORMAL HIGH (ref 11.5–15.5)
WBC: 14.4 10*3/uL — ABNORMAL HIGH (ref 4.0–10.5)
nRBC: 0.7 % — ABNORMAL HIGH (ref 0.0–0.2)

## 2020-08-26 LAB — MAGNESIUM: Magnesium: 2.4 mg/dL (ref 1.7–2.4)

## 2020-08-26 NOTE — Progress Notes (Signed)
[facility]  ROUNDING NOTE   Subjective:  Patient to undergo second dialysis treatment today. Still on the ventilator. Requiring 10 of PEEP.   Objective:  Vital signs in last 24 hours:  Temperature 97.5 pulse 66 respirations 26 blood pressure 124/70  Physical Exam: General:  Critically ill-appearing  Head:  Normocephalic, atraumatic.  Dry oral mucosa  Eyes:  Anicteric  Neck:  Tracheostomy in place  Lungs:   Coarse breath sounds bilateral, vent assisted  Heart:  S1S2 no rubs  Abdomen:   Soft, nontender, bowel sounds present  Extremities:  Marked peripheral edema  Neurologic:  Awake, alert, not following commands  Skin:  No acute rashes  Access:  Right IJ temporary dialysis catheter    Basic Metabolic Panel: Recent Labs  Lab 08/22/20 1020 08/22/20 1020 08/23/20 0458 08/23/20 0458 08/24/20 0602 08/25/20 0552 08/26/20 0415  NA 145  --  147*  --  144 141 139  K 4.5  --  4.8  --  4.9 4.7 5.0  CL 104  --  104  --  103 101 99  CO2 28  --  27  --  28 26 28   GLUCOSE 192*  --  201*  --  180* 205* 195*  BUN 164*  --  172*  --  182* 159* 159*  CREATININE 2.27*  --  2.33*  --  2.31* 2.03* 2.04*  CALCIUM 9.0   < > 9.0   < > 9.0 8.8* 8.9  MG 2.2  --  2.3  --  2.3 2.2 2.4  PHOS 6.3*  --  6.5*  --  6.6* 5.9* 6.3*   < > = values in this interval not displayed.    Liver Function Tests: Recent Labs  Lab 08/22/20 1020 08/23/20 0458 08/24/20 0602 08/25/20 0552 08/26/20 0415  ALBUMIN 3.5 3.3* 3.0* 3.0* 3.0*   No results for input(s): LIPASE, AMYLASE in the last 168 hours. No results for input(s): AMMONIA in the last 168 hours.  CBC: Recent Labs  Lab 08/22/20 1020 08/23/20 0458 08/24/20 0602 08/25/20 0552 08/26/20 0415  WBC 13.4* 11.7* 11.0* 10.5 14.4*  HGB 7.8* 7.5* 7.3* 6.5* 8.3*  HCT 25.4* 25.1* 25.0* 21.9* 27.1*  MCV 95.5 96.5 96.5 95.2 94.4  PLT 198 197 166 147* 144*    Cardiac Enzymes: No results for input(s): CKTOTAL, CKMB, CKMBINDEX,  TROPONINI in the last 168 hours.  BNP: Invalid input(s): POCBNP  CBG: No results for input(s): GLUCAP in the last 168 hours.  Microbiology: Results for orders placed or performed during the hospital encounter of 08/30/20  Urine Culture     Status: Abnormal   Collection Time: 08/17/20  1:32 PM   Specimen: Urine, Random  Result Value Ref Range Status   Specimen Description URINE, RANDOM  Final   Special Requests   Final    NONE Performed at [facility], [address], [place], [place]    Culture >=100,000 COLONIES/mL PSEUDOMONAS AERUGINOSA (A)  Final   Report Status 08/19/2020 FINAL  Final   Organism ID, Bacteria PSEUDOMONAS AERUGINOSA (A)  Final      Susceptibility   Pseudomonas aeruginosa - MIC*    CEFTAZIDIME 16 INTERMEDIATE Intermediate     CIPROFLOXACIN <=0.25 SENSITIVE Sensitive     GENTAMICIN <=1 SENSITIVE Sensitive     IMIPENEM 1 SENSITIVE Sensitive     PIP/TAZO 16 SENSITIVE Sensitive     * >=100,000 COLONIES/mL PSEUDOMONAS AERUGINOSA  Culture, blood (Routine X 2) w Reflex to  ID Panel     Status: None   Collection Time: 08/17/20  1:54 PM   Specimen: BLOOD LEFT HAND  Result Value Ref Range Status   Specimen Description BLOOD LEFT HAND  Final   Special Requests   Final    BOTTLES DRAWN AEROBIC AND ANAEROBIC Blood Culture adequate volume   Culture   Final    NO GROWTH 5 DAYS Performed at [facility], [address], [place], [place] [address]    Report Status 08/22/2020 FINAL  Final  Culture, blood (Routine X 2) w Reflex to ID Panel     Status: None   Collection Time: 08/17/20  2:15 PM   Specimen: BLOOD LEFT ARM  Result Value Ref Range Status   Specimen Description BLOOD LEFT ARM  Final   Special Requests   Final    BOTTLES DRAWN AEROBIC ONLY Blood Culture results may not be optimal due to an inadequate volume of blood received in culture bottles   Culture   Final    NO GROWTH 5 DAYS Performed at [facility], [address]  [place], [place] [address]    Report Status 08/22/2020 FINAL  Final  Culture, respiratory (non-expectorated)     Status: None   Collection Time: 08/17/20  6:14 PM   Specimen: Tracheal Aspirate; Respiratory  Result Value Ref Range Status   Specimen Description TRACHEAL ASPIRATE  Final   Special Requests NONE  Final   Gram Stain   Final    RARE WBC PRESENT, PREDOMINANTLY PMN FEW GRAM NEGATIVE RODS Performed at [facility], [address], [place], [place] [address]    Culture ABUNDANT PSEUDOMONAS AERUGINOSA  Final   Report Status 08/20/2020 FINAL  Final   Organism ID, Bacteria PSEUDOMONAS AERUGINOSA  Final      Susceptibility   Pseudomonas aeruginosa - MIC*    CEFTAZIDIME 4 SENSITIVE Sensitive     CIPROFLOXACIN <=0.25 SENSITIVE Sensitive     GENTAMICIN <=1 SENSITIVE Sensitive     IMIPENEM 1 SENSITIVE Sensitive     PIP/TAZO 8 SENSITIVE Sensitive     CEFEPIME 2 SENSITIVE Sensitive     * ABUNDANT PSEUDOMONAS AERUGINOSA    Coagulation Studies: No results for input(s): LABPROT, INR in the last 72 hours.  Urinalysis: No results for input(s): COLORURINE, LABSPEC, PHURINE, GLUCOSEU, HGBUR, BILIRUBINUR, KETONESUR, PROTEINUR, UROBILINOGEN, NITRITE, LEUKOCYTESUR in the last 72 hours.  Invalid input(s): APPERANCEUR    Imaging: IR Fluoro Guide CV Line Right  Result Date: 08/24/2020 CLINICAL DATA:  Renal failure and need for non tunneled hemodialysis catheter placement. EXAM: NON-TUNNELED CENTRAL VENOUS HEMODIALYSIS CATHETER PLACEMENT WITH ULTRASOUND AND FLUOROSCOPIC GUIDANCE FLUOROSCOPY TIME:  24 seconds.  2.0 mGy. PROCEDURE: The procedure, risks, benefits, and alternatives were explained to the patient's daughter. Questions regarding the procedure were encouraged and answered. The patient's daughter understands and consents to the procedure. A time-out was performed prior to initiating the procedure. The right neck and chest were prepped with chlorhexidine in a sterile fashion, and a  sterile drape was applied covering the operative field. Maximum barrier sterile technique with sterile gowns and gloves were used for the procedure. Local anesthesia was provided with 1% lidocaine. After creating a small venotomy incision, a 21 gauge needle was advanced into the right internal jugular vein under direct, real-time ultrasound guidance. Ultrasound image documentation was performed. After securing guidewire access the venotomy was dilated and a 13 [place], triple lumen, 20 cm Mahurkar catheter advanced over the wire. Final catheter  positioning was confirmed and documented with a fluoroscopic spot image. The catheter was aspirated, flushed with saline, and injected with appropriate volume heparin dwells. The catheter exit site was secured with 0-Prolene retention sutures. COMPLICATIONS: None.  No pneumothorax. FINDINGS: After catheter placement, the tip lies in the right atrium. The catheter aspirates normally and is ready for immediate use. IMPRESSION: Placement of non-tunneled central venous hemodialysis catheter via the right internal jugular vein. The catheter tip lies in the right atrium. The catheter is ready for immediate use. Electronically Signed   By: Irish Lack M.D.   On: 08/24/2020 14:51   IR US Guide Vasc Access Right  Result Date: 08/24/2020 CLINICAL DATA:  Renal failure and need for non tunneled hemodialysis catheter placement. EXAM: NON-TUNNELED CENTRAL VENOUS HEMODIALYSIS CATHETER PLACEMENT WITH ULTRASOUND AND FLUOROSCOPIC GUIDANCE FLUOROSCOPY TIME:  24 seconds.  2.0 mGy. PROCEDURE: The procedure, risks, benefits, and alternatives were explained to the patient's daughter. Questions regarding the procedure were encouraged and answered. The patient's daughter understands and consents to the procedure. A time-out was performed prior to initiating the procedure. The right neck and chest were prepped with chlorhexidine in a sterile fashion, and a sterile drape was applied covering  the operative field. Maximum barrier sterile technique with sterile gowns and gloves were used for the procedure. Local anesthesia was provided with 1% lidocaine. After creating a small venotomy incision, a 21 gauge needle was advanced into the right internal jugular vein under direct, real-time ultrasound guidance. Ultrasound image documentation was performed. After securing guidewire access the venotomy was dilated and a 13 [place], triple lumen, 20 cm Mahurkar catheter advanced over the wire. Final catheter positioning was confirmed and documented with a fluoroscopic spot image. The catheter was aspirated, flushed with saline, and injected with appropriate volume heparin dwells. The catheter exit site was secured with 0-Prolene retention sutures. COMPLICATIONS: None.  No pneumothorax. FINDINGS: After catheter placement, the tip lies in the right atrium. The catheter aspirates normally and is ready for immediate use. IMPRESSION: Placement of non-tunneled central venous hemodialysis catheter via the right internal jugular vein. The catheter tip lies in the right atrium. The catheter is ready for immediate use. Electronically Signed   By: Irish Lack M.D.   On: 08/24/2020 14:51     Medications:       Assessment/ Plan:  73 y.o. female with a PMHx of recent acute respiratory failure secondary to COVID-19 pneumonia, hypertension, hyperlipidemia, diabetes mellitus type 2, depression, anxiety, obesity, atrial fibrillation, history of MRSA bacteremia and fungal pneumonia, recent acute kidney injury, who was admitted to [facility] on 09/10/2020 for ongoing treatment of acute respiratory failure.   1.  Acute kidney injury.    Patient does have right IJ temporary dialysis catheter in place now.  Appreciate assistance by interventional radiology.  She did undergo first dialysis treatment.  We plan for second dialysis treatment today..  Dialysis treatment will be on Monday.  2.  Hypernatremia.  Significantly  improved.  Serum sodium down to 139.  Continue to monitor.  3.  Acute respiratory failure.  Continue ventilatory support at this time.  PEEP of 10 noted at this time  4.  Generalized edema/volume overload.  We will begin to increase ultrafiltration starting next week.  She will likely need albumin support with dialysis treatments.     LOS: 0 Davy Faught 10/15/20217:54 AM

## 2020-08-26 NOTE — Progress Notes (Addendum)
Pulmonary Critical Care Medicine [facility] [place]   PULMONARY CRITICAL CARE SERVICE  PROGRESS NOTE  Date of Service: 08/26/2020  Aimee Peck  QTM:[id number]  DOB: Mar 24, 1946   DOA: 09/06/2020  Referring Physician: Carron Curie, MD  HPI: Aimee Peck is a 74 y.o. female seen for follow up of Acute on Chronic Respiratory Failure.  Patient continues on full support at this time assist-control mode rate of 28 and FiO2 45% currently satting well no distress.  Medications: Reviewed on Rounds  Physical Exam:  Vitals: Pulse 62 respirations 26 BP 124/70 O2 sat 96% temp 97.5  Ventilator Settings ventilator mode AC VC plus rate 28 tidal volume 460 PEEP of 10 with an FiO2 45%  . General: Comfortable at this time . Eyes: Grossly normal lids, irises & conjunctiva . ENT: grossly tongue is normal . Neck: no obvious mass . Cardiovascular: S1 S2 normal no gallop . Respiratory: No rales or rhonchi noted . Abdomen: soft . Skin: no rash seen on limited exam . Musculoskeletal: not rigid . Psychiatric:unable to assess . Neurologic: no seizure no involuntary movements         Lab Data:   Basic Metabolic Panel: Recent Labs  Lab 08/22/20 1020 08/23/20 0458 08/24/20 0602 08/25/20 0552 08/26/20 0415  NA 145 147* 144 141 139  K 4.5 4.8 4.9 4.7 5.0  CL 104 104 103 101 99  CO2 28 27 28 26 28   GLUCOSE 192* 201* 180* 205* 195*  BUN 164* 172* 182* 159* 159*  CREATININE 2.27* 2.33* 2.31* 2.03* 2.04*  CALCIUM 9.0 9.0 9.0 8.8* 8.9  MG 2.2 2.3 2.3 2.2 2.4  PHOS 6.3* 6.5* 6.6* 5.9* 6.3*    ABG: Recent Labs  Lab 08/24/20 0530  PHART 7.343*  PCO2ART 52.5*  PO2ART 71.4*  HCO3 27.7  O2SAT 94.9    Liver Function Tests: Recent Labs  Lab 08/22/20 1020 08/23/20 0458 08/24/20 0602 08/25/20 0552 08/26/20 0415  ALBUMIN 3.5 3.3* 3.0* 3.0* 3.0*   No results for input(s): LIPASE, AMYLASE in the last 168 hours. No results for input(s): AMMONIA in the last 168  hours.  CBC: Recent Labs  Lab 08/22/20 1020 08/23/20 0458 08/24/20 0602 08/25/20 0552 08/26/20 0415  WBC 13.4* 11.7* 11.0* 10.5 14.4*  HGB 7.8* 7.5* 7.3* 6.5* 8.3*  HCT 25.4* 25.1* 25.0* 21.9* 27.1*  MCV 95.5 96.5 96.5 95.2 94.4  PLT 198 197 166 147* 144*    Cardiac Enzymes: No results for input(s): CKTOTAL, CKMB, CKMBINDEX, TROPONINI in the last 168 hours.  BNP (last 3 results) No results for input(s): BNP in the last 8760 hours.  ProBNP (last 3 results) No results for input(s): PROBNP in the last 8760 hours.  Radiological Exams: IR Fluoro Guide CV Line Right  Result Date: 08/24/2020 CLINICAL DATA:  Renal failure and need for non tunneled hemodialysis catheter placement. EXAM: NON-TUNNELED CENTRAL VENOUS HEMODIALYSIS CATHETER PLACEMENT WITH ULTRASOUND AND FLUOROSCOPIC GUIDANCE FLUOROSCOPY TIME:  24 seconds.  2.0 mGy. PROCEDURE: The procedure, risks, benefits, and alternatives were explained to the patient's daughter. Questions regarding the procedure were encouraged and answered. The patient's daughter understands and consents to the procedure. A time-out was performed prior to initiating the procedure. The right neck and chest were prepped with chlorhexidine in a sterile fashion, and a sterile drape was applied covering the operative field. Maximum barrier sterile technique with sterile gowns and gloves were used for the procedure. Local anesthesia was provided with 1% lidocaine. After creating a small venotomy incision, a  21 gauge needle was advanced into the right internal jugular vein under direct, real-time ultrasound guidance. Ultrasound image documentation was performed. After securing guidewire access the venotomy was dilated and a 13 [place], triple lumen, 20 cm [place]kar catheter advanced over the wire. Final catheter positioning was confirmed and documented with a fluoroscopic spot image. The catheter was aspirated, flushed with saline, and injected with appropriate volume  heparin dwells. The catheter exit site was secured with 0-Prolene retention sutures. COMPLICATIONS: None.  No pneumothorax. FINDINGS: After catheter placement, the tip lies in the right atrium. The catheter aspirates normally and is ready for immediate use. IMPRESSION: Placement of non-tunneled central venous hemodialysis catheter via the right internal jugular vein. The catheter tip lies in the right atrium. The catheter is ready for immediate use. Electronically Signed   By: Irish Lack M.D.   On: 08/24/2020 14:51   IR US Guide Vasc Access Right  Result Date: 08/24/2020 CLINICAL DATA:  Renal failure and need for non tunneled hemodialysis catheter placement. EXAM: NON-TUNNELED CENTRAL VENOUS HEMODIALYSIS CATHETER PLACEMENT WITH ULTRASOUND AND FLUOROSCOPIC GUIDANCE FLUOROSCOPY TIME:  24 seconds.  2.0 mGy. PROCEDURE: The procedure, risks, benefits, and alternatives were explained to the patient's daughter. Questions regarding the procedure were encouraged and answered. The patient's daughter understands and consents to the procedure. A time-out was performed prior to initiating the procedure. The right neck and chest were prepped with chlorhexidine in a sterile fashion, and a sterile drape was applied covering the operative field. Maximum barrier sterile technique with sterile gowns and gloves were used for the procedure. Local anesthesia was provided with 1% lidocaine. After creating a small venotomy incision, a 21 gauge needle was advanced into the right internal jugular vein under direct, real-time ultrasound guidance. Ultrasound image documentation was performed. After securing guidewire access the venotomy was dilated and a 13 [place], triple lumen, 20 cm Mah[place]kar catheter advanced over the wire. Final catheter positioning was confirmed and documented with a fluoroscopic spot image. The catheter was aspirated, flushed with saline, and injected with appropriate volume heparin dwells. The catheter exit  site was secured with 0-Prolene retention sutures. COMPLICATIONS: None.  No pneumothorax. FINDINGS: After catheter placement, the tip lies in the right atrium. The catheter aspirates normally and is ready for immediate use. IMPRESSION: Placement of non-tunneled central venous hemodialysis catheter via the right internal jugular vein. The catheter tip lies in the right atrium. The catheter is ready for immediate use. Electronically Signed   By: Irish Lack M.D.   On: 08/24/2020 14:51    Assessment/Plan Active Problems:   Acute on chronic respiratory failure with hypoxia (HCC)   COVID-19 virus infection   Atrial fibrillation with RVR (HCC)   Acute renal failure due to tubular necrosis (HCC)   Metabolic encephalopathy   1. Acute on chronic respiratory failure hypoxiapatient will continue on full support assist-control mode we will continue to attempt to wean FiO2 per protocol.  Continue aggressive pulmonary toilet supportive measures. 2. COVID-19 virus infection in recovery we will continue with supportive care. 3. Atrial fibrillation RVR rate is controlled 4. Acute renal failure with tubular necrosis no change 5. Metabolic encephalopathy at baseline   I have personally seen and evaluated the patient, evaluated laboratory and imaging results, formulated the assessment and plan and placed orders. The Patient requires high complexity decision making with multiple systems involvement.  Rounds were done with the Respiratory Therapy Director and Staff therapists and discussed with nursing staff also.  Yevonne Pax, MD [facility] Pulmonary  Critical Care Medicine Sleep Medicine

## 2020-08-27 DIAGNOSIS — I4891 Unspecified atrial fibrillation: Secondary | ICD-10-CM | POA: Diagnosis not present

## 2020-08-27 DIAGNOSIS — J9621 Acute and chronic respiratory failure with hypoxia: Secondary | ICD-10-CM | POA: Diagnosis not present

## 2020-08-27 DIAGNOSIS — N170 Acute kidney failure with tubular necrosis: Secondary | ICD-10-CM | POA: Diagnosis not present

## 2020-08-27 DIAGNOSIS — U071 COVID-19: Secondary | ICD-10-CM | POA: Diagnosis not present

## 2020-08-27 LAB — URINALYSIS, ROUTINE W REFLEX MICROSCOPIC
Bacteria, UA: NONE SEEN
Bilirubin Urine: NEGATIVE
Glucose, UA: NEGATIVE mg/dL
Ketones, ur: NEGATIVE mg/dL
Nitrite: NEGATIVE
Protein, ur: 30 mg/dL — AB
Specific Gravity, Urine: 1.012 (ref 1.005–1.030)
pH: 5 (ref 5.0–8.0)

## 2020-08-27 LAB — CBC
HCT: 25.7 % — ABNORMAL LOW (ref 36.0–46.0)
Hemoglobin: 8 g/dL — ABNORMAL LOW (ref 12.0–15.0)
MCH: 28.9 pg (ref 26.0–34.0)
MCHC: 31.1 g/dL (ref 30.0–36.0)
MCV: 92.8 fL (ref 80.0–100.0)
Platelets: 105 10*3/uL — ABNORMAL LOW (ref 150–400)
RBC: 2.77 MIL/uL — ABNORMAL LOW (ref 3.87–5.11)
RDW: 19 % — ABNORMAL HIGH (ref 11.5–15.5)
WBC: 15 10*3/uL — ABNORMAL HIGH (ref 4.0–10.5)
nRBC: 0.3 % — ABNORMAL HIGH (ref 0.0–0.2)

## 2020-08-27 LAB — RENAL FUNCTION PANEL
Albumin: 3 g/dL — ABNORMAL LOW (ref 3.5–5.0)
Anion gap: 15 (ref 5–15)
BUN: 114 mg/dL — ABNORMAL HIGH (ref 8–23)
CO2: 23 mmol/L (ref 22–32)
Calcium: 8.9 mg/dL (ref 8.9–10.3)
Chloride: 102 mmol/L (ref 98–111)
Creatinine, Ser: 1.57 mg/dL — ABNORMAL HIGH (ref 0.44–1.00)
GFR, Estimated: 32 mL/min — ABNORMAL LOW (ref >60)
Glucose, Bld: 131 mg/dL — ABNORMAL HIGH (ref 70–99)
Phosphorus: 5.2 mg/dL — ABNORMAL HIGH (ref 2.5–4.6)
Potassium: 4.8 mmol/L (ref 3.5–5.1)
Sodium: 140 mmol/L (ref 135–145)

## 2020-08-27 LAB — MAGNESIUM: Magnesium: 2.2 mg/dL (ref 1.7–2.4)

## 2020-08-27 NOTE — Progress Notes (Signed)
Pulmonary Critical Care Medicine [facility] [place]   PULMONARY CRITICAL CARE SERVICE  PROGRESS NOTE  Date of Service: 08/27/2020  Aimee Peck  QJJ:[id number]  DOB: 1946-08-21   DOA: 09/02/20  Referring Physician: Carron Curie, MD  HPI: Aimee Peck is a 74 y.o. female seen for follow up of Acute on Chronic Respiratory Failure.  This morning patient was on assist control full support.  Respiratory rate still at 28 mechanics have been poor not tolerating weaning  Medications: Reviewed on Rounds  Physical Exam:  Vitals: Temperature is 96.2 pulse 70 respiratory rate is 30 blood pressure is 107/72 saturations 94%  Ventilator Settings on assist control FiO2 is 45% tidal volume 460 PEEP 8  . General: Comfortable at this time . Eyes: Grossly normal lids, irises & conjunctiva . ENT: grossly tongue is normal . Neck: no obvious mass . Cardiovascular: S1 S2 normal no gallop . Respiratory: Scattered rhonchi very coarse breath sounds . Abdomen: soft . Skin: no rash seen on limited exam . Musculoskeletal: not rigid . Psychiatric:unable to assess . Neurologic: no seizure no involuntary movements         Lab Data:   Basic Metabolic Panel: Recent Labs  Lab 08/23/20 0458 08/24/20 0602 08/25/20 0552 08/26/20 0415 08/27/20 0509  NA 147* 144 141 139 140  K 4.8 4.9 4.7 5.0 4.8  CL 104 103 101 99 102  CO2 27 28 26 28 23   GLUCOSE 201* 180* 205* 195* 131*  BUN 172* 182* 159* 159* 114*  CREATININE 2.33* 2.31* 2.03* 2.04* 1.57*  CALCIUM 9.0 9.0 8.8* 8.9 8.9  MG 2.3 2.3 2.2 2.4 2.2  PHOS 6.5* 6.6* 5.9* 6.3* 5.2*    ABG: Recent Labs  Lab 08/24/20 0530  PHART 7.343*  PCO2ART 52.5*  PO2ART 71.4*  HCO3 27.7  O2SAT 94.9    Liver Function Tests: Recent Labs  Lab 08/23/20 0458 08/24/20 0602 08/25/20 0552 08/26/20 0415 08/27/20 0509  ALBUMIN 3.3* 3.0* 3.0* 3.0* 3.0*   No results for input(s): LIPASE, AMYLASE in the last 168 hours. No results for  input(s): AMMONIA in the last 168 hours.  CBC: Recent Labs  Lab 08/23/20 0458 08/24/20 0602 08/25/20 0552 08/26/20 0415 08/27/20 0509  WBC 11.7* 11.0* 10.5 14.4* 15.0*  HGB 7.5* 7.3* 6.5* 8.3* 8.0*  HCT 25.1* 25.0* 21.9* 27.1* 25.7*  MCV 96.5 96.5 95.2 94.4 92.8  PLT 197 166 147* 144* 105*    Cardiac Enzymes: No results for input(s): CKTOTAL, CKMB, CKMBINDEX, TROPONINI in the last 168 hours.  BNP (last 3 results) No results for input(s): BNP in the last 8760 hours.  ProBNP (last 3 results) No results for input(s): PROBNP in the last 8760 hours.  Radiological Exams: No results found.  Assessment/Plan Active Problems:   Acute on chronic respiratory failure with hypoxia (HCC)   COVID-19 virus infection   Atrial fibrillation with RVR (HCC)   Acute renal failure due to tubular necrosis (HCC)   Metabolic encephalopathy   1. Acute on chronic respiratory failure hypoxia plan is to continue with assessing the mechanics RSB I try to wean the patient today again 2. COVID-19 virus infection in recovery phase we will continue with supportive care 3. Atrial fibrillation RVR rate is controlled we will continue to follow 4. Acute renal failure being seen by nephrology 5. Metabolic encephalopathy unchanged   I have personally seen and evaluated the patient, evaluated laboratory and imaging results, formulated the assessment and plan and placed orders. The Patient requires high  complexity decision making with multiple systems involvement.  Rounds were done with the Respiratory Therapy Director and Staff therapists and discussed with nursing staff also.  Allyne Gee, MD [facility] Pulmonary Critical Care Medicine Sleep Medicine

## 2020-08-28 ENCOUNTER — Other Ambulatory Visit (HOSPITAL_COMMUNITY): Payer: Medicare Other

## 2020-08-28 DIAGNOSIS — U071 COVID-19: Secondary | ICD-10-CM | POA: Diagnosis not present

## 2020-08-28 DIAGNOSIS — I4891 Unspecified atrial fibrillation: Secondary | ICD-10-CM | POA: Diagnosis not present

## 2020-08-28 DIAGNOSIS — J9621 Acute and chronic respiratory failure with hypoxia: Secondary | ICD-10-CM | POA: Diagnosis not present

## 2020-08-28 DIAGNOSIS — N170 Acute kidney failure with tubular necrosis: Secondary | ICD-10-CM | POA: Diagnosis not present

## 2020-08-28 LAB — RENAL FUNCTION PANEL
Albumin: 2.6 g/dL — ABNORMAL LOW (ref 3.5–5.0)
Anion gap: 11 (ref 5–15)
BUN: 127 mg/dL — ABNORMAL HIGH (ref 8–23)
CO2: 28 mmol/L (ref 22–32)
Calcium: 8.8 mg/dL — ABNORMAL LOW (ref 8.9–10.3)
Chloride: 101 mmol/L (ref 98–111)
Creatinine, Ser: 1.7 mg/dL — ABNORMAL HIGH (ref 0.44–1.00)
GFR, Estimated: 29 mL/min — ABNORMAL LOW (ref >60)
Glucose, Bld: 123 mg/dL — ABNORMAL HIGH (ref 70–99)
Phosphorus: 5.5 mg/dL — ABNORMAL HIGH (ref 2.5–4.6)
Potassium: 4.9 mmol/L (ref 3.5–5.1)
Sodium: 140 mmol/L (ref 135–145)

## 2020-08-28 LAB — CBC
HCT: 25.7 % — ABNORMAL LOW (ref 36.0–46.0)
Hemoglobin: 7.8 g/dL — ABNORMAL LOW (ref 12.0–15.0)
MCH: 29.8 pg (ref 26.0–34.0)
MCHC: 30.4 g/dL (ref 30.0–36.0)
MCV: 98.1 fL (ref 80.0–100.0)
Platelets: 130 10*3/uL — ABNORMAL LOW (ref 150–400)
RBC: 2.62 MIL/uL — ABNORMAL LOW (ref 3.87–5.11)
RDW: 18.8 % — ABNORMAL HIGH (ref 11.5–15.5)
WBC: 13.6 10*3/uL — ABNORMAL HIGH (ref 4.0–10.5)
nRBC: 0.3 % — ABNORMAL HIGH (ref 0.0–0.2)

## 2020-08-28 LAB — URINE CULTURE: Culture: 70000 — AB

## 2020-08-28 LAB — MAGNESIUM: Magnesium: 2.3 mg/dL (ref 1.7–2.4)

## 2020-08-28 NOTE — Progress Notes (Addendum)
Pulmonary Critical Care Medicine [facility] [place]   PULMONARY CRITICAL CARE SERVICE  PROGRESS NOTE  Date of Service: 08/28/2020  Aimee Peck  OIZ:[id number]  DOB: [birth year]   DOA: 08/19/2020  Referring Physician: Carron Curie, MD  HPI: Aimee Peck is a 74 y.o. female seen for follow up of Acute on Chronic Respiratory Failure.  Patient completed 8 hours on pressure support today is now resting back on full support on the vent currently rate of 28 and FiO2 of 50% satting well this time no distress.  Medications: Reviewed on Rounds  Physical Exam:  Vitals: Pulse 73 respirations 35 BP 118/74 O2 sat 97% temp 96.7  Ventilator Settings ventilator mode AC VC rate of 28 tidal volume 460 PEEP of 7 FiO2 50%  . General: Comfortable at this time . Eyes: Grossly normal lids, irises & conjunctiva . ENT: grossly tongue is normal . Neck: no obvious mass . Cardiovascular: S1 S2 normal no gallop . Respiratory: No rales or rhonchi noted . Abdomen: soft . Skin: no rash seen on limited exam . Musculoskeletal: not rigid . Psychiatric:unable to assess . Neurologic: no seizure no involuntary movements         Lab Data:   Basic Metabolic Panel: Recent Labs  Lab 08/24/20 0602 08/25/20 0552 08/26/20 0415 08/27/20 0509 08/28/20 0614  NA 144 141 139 140 140  K 4.9 4.7 5.0 4.8 4.9  CL 103 101 99 102 101  CO2 28 26 28 23 28   GLUCOSE 180* 205* 195* 131* 123*  BUN 182* 159* 159* 114* 127*  CREATININE 2.31* 2.03* 2.04* 1.57* 1.70*  CALCIUM 9.0 8.8* 8.9 8.9 8.8*  MG 2.3 2.2 2.4 2.2 2.3  PHOS 6.6* 5.9* 6.3* 5.2* 5.5*    ABG: Recent Labs  Lab 08/24/20 0530  PHART 7.343*  PCO2ART 52.5*  PO2ART 71.4*  HCO3 27.7  O2SAT 94.9    Liver Function Tests: Recent Labs  Lab 08/24/20 0602 08/25/20 0552 08/26/20 0415 08/27/20 0509 08/28/20 0614  ALBUMIN 3.0* 3.0* 3.0* 3.0* 2.6*   No results for input(s): LIPASE, AMYLASE in the last 168 hours. No results for  input(s): AMMONIA in the last 168 hours.  CBC: Recent Labs  Lab 08/24/20 0602 08/25/20 0552 08/26/20 0415 08/27/20 0509 08/28/20 0614  WBC 11.0* 10.5 14.4* 15.0* 13.6*  HGB 7.3* 6.5* 8.3* 8.0* 7.8*  HCT 25.0* 21.9* 27.1* 25.7* 25.7*  MCV 96.5 95.2 94.4 92.8 98.1  PLT 166 147* 144* 105* 130*    Cardiac Enzymes: No results for input(s): CKTOTAL, CKMB, CKMBINDEX, TROPONINI in the last 168 hours.  BNP (last 3 results) No results for input(s): BNP in the last 8760 hours.  ProBNP (last 3 results) No results for input(s): PROBNP in the last 8760 hours.  Radiological Exams: DG CHEST PORT 1 VIEW  Result Date: 08/28/2020 CLINICAL DATA:  Respiratory failure, pneumonia, ventilator dependent EXAM: PORTABLE CHEST 1 VIEW COMPARISON:  08/24/2020 FINDINGS: Multifocal patchy opacities, right upper lobe predominant. Possible small left pleural effusion. No pneumothorax. This appearance is unchanged. Cardiomegaly. Tracheostomy in satisfactory position. Two right IJ venous catheter is terminating in the right atrium, one of which is new. Enteric tube coursing into the stomach, new. IMPRESSION: Multifocal pneumonia, right upper lobe predominant, unchanged. Possible small left pleural effusion. New right IJ venous catheter and enteric tube. Additional stable support apparatus as above. Electronically Signed   By: 08/26/2020 M.D.   On: 08/28/2020 06:13   DG Abd Portable 1V  Result Date: 08/28/2020 CLINICAL  DATA:  Nasogastric tube placement EXAM: PORTABLE ABDOMEN - 1 VIEW COMPARISON:  30-Aug-2020 FINDINGS: NG tube tip and side port project within the stomach. Nonobstructive bowel gas pattern. IMPRESSION: NG tube tip and side port in the stomach. Electronically Signed   By: Deatra Robinson M.D.   On: 08/28/2020 00:42    Assessment/Plan Active Problems:   Acute on chronic respiratory failure with hypoxia (HCC)   COVID-19 virus infection   Atrial fibrillation with RVR (HCC)   Acute renal failure  due to tubular necrosis (HCC)   Metabolic encephalopathy   1. Acute on chronic respiratory failure hypoxia patient will continue to wean as per protocol.  Completed 8 hours today on pressure support will continue aggressive pulmonary toilet supportive measures. 2. COVID-19 virus infection in recovery phase we will continue with supportive care 3. Atrial fibrillation RVR rate is controlled we will continue to follow 4. Acute renal failure being seen by nephrology 5. Metabolic encephalopathy unchanged   I have personally seen and evaluated the patient, evaluated laboratory and imaging results, formulated the assessment and plan and placed orders. The Patient requires high complexity decision making with multiple systems involvement.  Rounds were done with the Respiratory Therapy Director and Staff therapists and discussed with nursing staff also.  Yevonne Pax, MD [facility] Pulmonary Critical Care Medicine Sleep Medicine

## 2020-08-29 DIAGNOSIS — I4891 Unspecified atrial fibrillation: Secondary | ICD-10-CM | POA: Diagnosis not present

## 2020-08-29 DIAGNOSIS — U071 COVID-19: Secondary | ICD-10-CM | POA: Diagnosis not present

## 2020-08-29 DIAGNOSIS — J9621 Acute and chronic respiratory failure with hypoxia: Secondary | ICD-10-CM | POA: Diagnosis not present

## 2020-08-29 DIAGNOSIS — N170 Acute kidney failure with tubular necrosis: Secondary | ICD-10-CM | POA: Diagnosis not present

## 2020-08-29 LAB — RENAL FUNCTION PANEL
Albumin: 2.6 g/dL — ABNORMAL LOW (ref 3.5–5.0)
Anion gap: 13 (ref 5–15)
BUN: 133 mg/dL — ABNORMAL HIGH (ref 8–23)
CO2: 29 mmol/L (ref 22–32)
Calcium: 8.9 mg/dL (ref 8.9–10.3)
Chloride: 101 mmol/L (ref 98–111)
Creatinine, Ser: 1.82 mg/dL — ABNORMAL HIGH (ref 0.44–1.00)
GFR, Estimated: 27 mL/min — ABNORMAL LOW (ref >60)
Glucose, Bld: 171 mg/dL — ABNORMAL HIGH (ref 70–99)
Phosphorus: 5.2 mg/dL — ABNORMAL HIGH (ref 2.5–4.6)
Potassium: 4 mmol/L (ref 3.5–5.1)
Sodium: 143 mmol/L (ref 135–145)

## 2020-08-29 LAB — CBC
HCT: 25.4 % — ABNORMAL LOW (ref 36.0–46.0)
Hemoglobin: 7.7 g/dL — ABNORMAL LOW (ref 12.0–15.0)
MCH: 29.3 pg (ref 26.0–34.0)
MCHC: 30.3 g/dL (ref 30.0–36.0)
MCV: 96.6 fL (ref 80.0–100.0)
Platelets: 140 10*3/uL — ABNORMAL LOW (ref 150–400)
RBC: 2.63 MIL/uL — ABNORMAL LOW (ref 3.87–5.11)
RDW: 19.3 % — ABNORMAL HIGH (ref 11.5–15.5)
WBC: 13.5 10*3/uL — ABNORMAL HIGH (ref 4.0–10.5)
nRBC: 0.3 % — ABNORMAL HIGH (ref 0.0–0.2)

## 2020-08-29 LAB — MAGNESIUM: Magnesium: 2.3 mg/dL (ref 1.7–2.4)

## 2020-08-29 NOTE — Consult Note (Signed)
Chief Complaint: Patient was seen in consultation today for dysphagia/gastrostomy tube replacement versus new placement.  Referring Physician(s): [facility])  Supervising Physician: Gilmer Mor  Patient Status: SSH-inpt  History of Present Illness: Aimee Peck is a 74 y.o. female with a past medical history of atrial fibrillation, acute on chronic respiratory failure with hypoxia s/p tracheostomy, acute renal failure, and COVID-19 infection. She has been admitted to [facility] since 2020-09-11 for management of above comorbidities. She had a gastrostomy tube placed by outside facility for nutritional support- this was inadvertently removed 08/27/2020.  IR consulted by Merril Abbe, NP for possible image-guided gastrostomy tube replacement versus new placement. Patient awake laying in bed, eyes open, tracheostomy in place. Eyes do not follow around room. She does not follow simple commands. History difficult to obtain secondary to this.  LD Eliquis today.   Past Medical History:  Diagnosis Date  . Acute on chronic respiratory failure with hypoxia (HCC)   . Acute renal failure due to tubular necrosis (HCC)   . Atrial fibrillation with RVR (HCC)   . COVID-19 virus infection   . Metabolic encephalopathy     Allergies: Patient has no allergy information on record.  Medications: Prior to Admission medications   Not on File     No family history on file.  Social History   Socioeconomic History  . Marital status: Single    Spouse name: Not on file  . Number of children: Not on file  . Years of education: Not on file  . Highest education level: Not on file  Occupational History  . Not on file  Tobacco Use  . Smoking status: Not on file  Substance and Sexual Activity  . Alcohol use: Not on file  . Drug use: Not on file  . Sexual activity: Not on file  Other Topics Concern  . Not on file  Social History Narrative  . Not on file   Social Determinants of Health    Financial Resource Strain:   . Difficulty of Paying Living Expenses: Not on file  Food Insecurity:   . Worried About Programme researcher, broadcasting/film/video in the Last Year: Not on file  . Ran Out of Food in the Last Year: Not on file  Transportation Needs:   . Lack of Transportation (Medical): Not on file  . Lack of Transportation (Non-Medical): Not on file  Physical Activity:   . Days of Exercise per Week: Not on file  . Minutes of Exercise per Session: Not on file  Stress:   . Feeling of Stress : Not on file  Social Connections:   . Frequency of Communication with Friends and Family: Not on file  . Frequency of Social Gatherings with Friends and Family: Not on file  . Attends Religious Services: Not on file  . Active Member of Clubs or Organizations: Not on file  . Attends Banker Meetings: Not on file  . Marital Status: Not on file     Review of Systems: A 12 point ROS discussed and pertinent positives are indicated in the HPI above.  All other systems are negative.  Review of Systems  Unable to perform ROS: Other (Tracheostomy; AMS)    Vital Signs: There were no vitals taken for this visit.  Physical Exam Vitals and nursing note reviewed.  Constitutional:      General: She is not in acute distress.    Appearance: She is ill-appearing.     Comments: Tracheostomy.  Cardiovascular:  Rate and Rhythm: Normal rate and regular rhythm.     Heart sounds: Normal heart sounds. No murmur heard.   Pulmonary:     Effort: Pulmonary effort is normal. No respiratory distress.     Breath sounds: Normal breath sounds. No wheezing.     Comments: Tracheostomy. Skin:    General: Skin is warm and dry.  Neurological:     Comments: Eyes open- they do not track. Does not follow simple commands.      MD Evaluation Airway: Other (comments) (Tracheostomy) Heart: WNL Abdomen: WNL Chest/ Lungs: WNL ASA  Classification: 3 Mallampati/Airway Score: Three  (Tracheostomy)   Imaging: IR Fluoro Guide CV Line Right  Result Date: 08/24/2020 CLINICAL DATA:  Renal failure and need for non tunneled hemodialysis catheter placement. EXAM: NON-TUNNELED CENTRAL VENOUS HEMODIALYSIS CATHETER PLACEMENT WITH ULTRASOUND AND FLUOROSCOPIC GUIDANCE FLUOROSCOPY TIME:  24 seconds.  2.0 mGy. PROCEDURE: The procedure, risks, benefits, and alternatives were explained to the patient's daughter. Questions regarding the procedure were encouraged and answered. The patient's daughter understands and consents to the procedure. A time-out was performed prior to initiating the procedure. The right neck and chest were prepped with chlorhexidine in a sterile fashion, and a sterile drape was applied covering the operative field. Maximum barrier sterile technique with sterile gowns and gloves were used for the procedure. Local anesthesia was provided with 1% lidocaine. After creating a small venotomy incision, a 21 gauge needle was advanced into the right internal jugular vein under direct, real-time ultrasound guidance. Ultrasound image documentation was performed. After securing guidewire access the venotomy was dilated and a 13 [place], triple lumen, 20 cm Mahurkar catheter advanced over the wire. Final catheter positioning was confirmed and documented with a fluoroscopic spot image. The catheter was aspirated, flushed with saline, and injected with appropriate volume heparin dwells. The catheter exit site was secured with 0-Prolene retention sutures. COMPLICATIONS: None.  No pneumothorax. FINDINGS: After catheter placement, the tip lies in the right atrium. The catheter aspirates normally and is ready for immediate use. IMPRESSION: Placement of non-tunneled central venous hemodialysis catheter via the right internal jugular vein. The catheter tip lies in the right atrium. The catheter is ready for immediate use. Electronically Signed   By: Irish Lack M.D.   On: 08/24/2020 14:51   IR US  Guide Vasc Access Right  Result Date: 08/24/2020 CLINICAL DATA:  Renal failure and need for non tunneled hemodialysis catheter placement. EXAM: NON-TUNNELED CENTRAL VENOUS HEMODIALYSIS CATHETER PLACEMENT WITH ULTRASOUND AND FLUOROSCOPIC GUIDANCE FLUOROSCOPY TIME:  24 seconds.  2.0 mGy. PROCEDURE: The procedure, risks, benefits, and alternatives were explained to the patient's daughter. Questions regarding the procedure were encouraged and answered. The patient's daughter understands and consents to the procedure. A time-out was performed prior to initiating the procedure. The right neck and chest were prepped with chlorhexidine in a sterile fashion, and a sterile drape was applied covering the operative field. Maximum barrier sterile technique with sterile gowns and gloves were used for the procedure. Local anesthesia was provided with 1% lidocaine. After creating a small venotomy incision, a 21 gauge needle was advanced into the right internal jugular vein under direct, real-time ultrasound guidance. Ultrasound image documentation was performed. After securing guidewire access the venotomy was dilated and a 13 [place], triple lumen, 20 cm Mah[place]kar catheter advanced over the wire. Final catheter positioning was confirmed and documented with a fluoroscopic spot image. The catheter was aspirated, flushed with saline, and injected with appropriate volume heparin dwells. The catheter exit site  was secured with 0-Prolene retention sutures. COMPLICATIONS: None.  No pneumothorax. FINDINGS: After catheter placement, the tip lies in the right atrium. The catheter aspirates normally and is ready for immediate use. IMPRESSION: Placement of non-tunneled central venous hemodialysis catheter via the right internal jugular vein. The catheter tip lies in the right atrium. The catheter is ready for immediate use. Electronically Signed   By: Irish LackGlenn  Yamagata M.D.   On: 08/24/2020 14:51   DG ABDOMEN PEG TUBE LOCATION  Result  Date: 08/15/2020 CLINICAL DATA:  Check gastrostomy catheter placed EXAM: ABDOMEN - 1 VIEW COMPARISON:  None. FINDINGS: Gastrostomy catheter is noted within the stomach. Injected contrast passes into the stomach and subsequently into the duodenum. No obstructive changes are seen. IMPRESSION: Gastrostomy catheter within the stomach. Electronically Signed   By: Alcide CleverMark  Lukens M.D.   On: 09/07/2020 22:44   DG CHEST PORT 1 VIEW  Result Date: 08/28/2020 CLINICAL DATA:  Respiratory failure, pneumonia, ventilator dependent EXAM: PORTABLE CHEST 1 VIEW COMPARISON:  08/24/2020 FINDINGS: Multifocal patchy opacities, right upper lobe predominant. Possible small left pleural effusion. No pneumothorax. This appearance is unchanged. Cardiomegaly. Tracheostomy in satisfactory position. Two right IJ venous catheter is terminating in the right atrium, one of which is new. Enteric tube coursing into the stomach, new. IMPRESSION: Multifocal pneumonia, right upper lobe predominant, unchanged. Possible small left pleural effusion. New right IJ venous catheter and enteric tube. Additional stable support apparatus as above. Electronically Signed   By: Charline BillsSriyesh  Krishnan M.D.   On: 08/28/2020 06:13   DG CHEST PORT 1 VIEW  Result Date: 08/24/2020 CLINICAL DATA:  Pneumonia. EXAM: PORTABLE CHEST 1 VIEW COMPARISON:  08/22/2020 FINDINGS: 0528 hours. The cardio pericardial silhouette is enlarged. Tracheostomy tube remains in place. Right IJ central line tip overlies the right atrium. The diffuse interstitial and airspace opacity is similar to prior. No substantial pleural effusion. Bones are demineralized. Telemetry leads overlie the chest. IMPRESSION: No substantial interval change in exam. Diffuse interstitial and airspace disease. Electronically Signed   By: Kennith CenterEric  Mansell M.D.   On: 08/24/2020 06:00   DG CHEST PORT 1 VIEW  Result Date: 08/22/2020 CLINICAL DATA:  74 year old female with history of pulmonary edema or pneumonia. EXAM:  PORTABLE CHEST 1 VIEW COMPARISON:  Chest x-ray 08/31/2020. FINDINGS: A tracheostomy tube is in place with tip 6.6 cm above the carina. There is a right-sided internal jugular central venous catheter with tip terminating in the right atrium. Lung volumes are normal. Widespread interstitial and airspace opacities are noted throughout the lungs bilaterally (left greater than right), with no significant change in aeration compared to the prior study. Possible trace bilateral pleural effusions. No pneumothorax. Mild cardiomegaly. The patient is rotated to the right on today's exam, resulting in distortion of the mediastinal contours and reduced diagnostic sensitivity and specificity for mediastinal pathology. Aortic atherosclerosis. IMPRESSION: 1. The appearance the chest is very similar to the prior study, with a spectrum of findings favored to reflect underlying congestive heart failure, although multilobar bilateral pneumonia is difficult to entirely exclude. 2. Aortic atherosclerosis. Electronically Signed   By: Trudie Reedaniel  Entrikin M.D.   On: 08/22/2020 07:51   DG Chest Port 1 View  Result Date: 08/15/2020 CLINICAL DATA:  History of respiratory failure EXAM: PORTABLE CHEST 1 VIEW COMPARISON:  None FINDINGS: Cardiac shadow is mildly enlarged but accentuated by the portable technique. Tracheostomy tube and right jugular central line are seen in satisfactory position. Diffuse airspace opacities are noted bilaterally. No sizable effusion or pneumothorax is noted.  No bony abnormality is seen. IMPRESSION: Diffuse airspace opacities bilaterally. Although portion of this may be related to pulmonary edema possibility of underlying multifocal pneumonia deserves consideration as well. Clinical correlation is recommended. Electronically Signed   By: Alcide Clever M.D.   On: 09-01-2020 22:44   DG Abd Portable 1V  Result Date: 08/28/2020 CLINICAL DATA:  Nasogastric tube placement EXAM: PORTABLE ABDOMEN - 1 VIEW COMPARISON:   09/01/20 FINDINGS: NG tube tip and side port project within the stomach. Nonobstructive bowel gas pattern. IMPRESSION: NG tube tip and side port in the stomach. Electronically Signed   By: Deatra Robinson M.D.   On: 08/28/2020 00:42    Labs:  CBC: Recent Labs    08/26/20 0415 08/27/20 0509 08/28/20 0614 08/29/20 0703  WBC 14.4* 15.0* 13.6* 13.5*  HGB 8.3* 8.0* 7.8* 7.7*  HCT 27.1* 25.7* 25.7* 25.4*  PLT 144* 105* 130* 140*    COAGS: Recent Labs    08/17/20 1018  INR 1.2    BMP: Recent Labs    08/26/20 0415 08/27/20 0509 08/28/20 0614 08/29/20 0703  NA 139 140 140 143  K 5.0 4.8 4.9 4.0  CL 99 102 101 101  CO2 28 23 28 29   GLUCOSE 195* 131* 123* 171*  BUN 159* 114* 127* 133*  CALCIUM 8.9 8.9 8.8* 8.9  CREATININE 2.04* 1.57* 1.70* 1.82*  GFRNONAA 23* 32* 29* 27*    LIVER FUNCTION TESTS: Recent Labs    08/17/20 1018 08/18/20 0554 08/26/20 0415 08/27/20 0509 08/28/20 0614 08/29/20 0703  BILITOT 0.4  --   --   --   --   --   AST 25  --   --   --   --   --   ALT 48*  --   --   --   --   --   ALKPHOS 113  --   --   --   --   --   PROT 5.9*  --   --   --   --   --   ALBUMIN 2.4*   < > 3.0* 3.0* 2.6* 2.6*   < > = values in this interval not displayed.     Assessment and Plan:  Dysphagia s/p gastrostomy tube placement at outside facility, which was inadvertently removed. Plan for image-guided gastrostomy tube placement versus replacement in IR tentatively for Thursday 09/01/2020 pending IR scheduling. Patient will be NPO at midnight prior to procedure. Afebrile. LD Eliquis today- will hold at midnight, earliest procedure can occur with hold is Thursday due to IR protocol. INR 1.2 08/17/2020. COVID pending.  Risks and benefits discussed with the patient including, but not limited to the need for a barium enema during the procedure, bleeding, infection, peritonitis, or damage to adjacent structures. All of the patient's questions were answered, patient is  agreeable to proceed. Consent obtained by patient's daughter, 10/17/2020, via telephone- signed and in IR control room.   Thank you for this interesting consult.  I greatly enjoyed meeting MORGANNE HAILE and look forward to participating in their care.  A copy of this report was sent to the requesting provider on this date.  Electronically Signed: Lorrin Mais, PA-C 08/29/2020, 3:03 PM   I spent a total of 20 Minutes in face to face in clinical consultation, greater than 50% of which was counseling/coordinating care for dysphagia/gastrostomy tube replacement versus new placement.

## 2020-08-29 NOTE — Progress Notes (Signed)
Pulmonary Critical Care Medicine [facility]   PULMONARY CRITICAL CARE SERVICE  PROGRESS NOTE  Date of Service: 08/29/2020  BARRY CULVERHOUSE  NKN:[id number]  DOB: [birth year]   DOA: August 25, 2020  Referring Physician: Carron Curie, MD  HPI: Aimee Peck is a 74 y.o. female seen for follow up of Acute on Chronic Respiratory Failure.  Patient currently is on pressure support on 40% FiO2 goal is for 12 hours  Medications: Reviewed on Rounds  Physical Exam:  Vitals: Temperature is 97.0 pulse 113 respiratory 30 blood pressure is 93/55 saturations 96%  Ventilator Settings on pressure support FiO2 40% pressure of 12/8  . General: Comfortable at this time . Eyes: Grossly normal lids, irises & conjunctiva . ENT: grossly tongue is normal . Neck: no obvious mass . Cardiovascular: S1 S2 normal no gallop . Respiratory: No rhonchi very coarse breath sounds . Abdomen: soft . Skin: no rash seen on limited exam . Musculoskeletal: not rigid . Psychiatric:unable to assess . Neurologic: no seizure no involuntary movements         Lab Data:   Basic Metabolic Panel: Recent Labs  Lab 08/25/20 0552 08/26/20 0415 08/27/20 0509 08/28/20 0614 08/29/20 0703  NA 141 139 140 140 143  K 4.7 5.0 4.8 4.9 4.0  CL 101 99 102 101 101  CO2 26 28 23 28 29   GLUCOSE 205* 195* 131* 123* 171*  BUN 159* 159* 114* 127* 133*  CREATININE 2.03* 2.04* 1.57* 1.70* 1.82*  CALCIUM 8.8* 8.9 8.9 8.8* 8.9  MG 2.2 2.4 2.2 2.3 2.3  PHOS 5.9* 6.3* 5.2* 5.5* 5.2*    ABG: Recent Labs  Lab 08/24/20 0530  PHART 7.343*  PCO2ART 52.5*  PO2ART 71.4*  HCO3 27.7  O2SAT 94.9    Liver Function Tests: Recent Labs  Lab 08/25/20 0552 08/26/20 0415 08/27/20 0509 08/28/20 0614 08/29/20 0703  ALBUMIN 3.0* 3.0* 3.0* 2.6* 2.6*   No results for input(s): LIPASE, AMYLASE in the last 168 hours. No results for input(s): AMMONIA in the last 168 hours.  CBC: Recent Labs  Lab 08/25/20 0552  08/26/20 0415 08/27/20 0509 08/28/20 0614 08/29/20 0703  WBC 10.5 14.4* 15.0* 13.6* 13.5*  HGB 6.5* 8.3* 8.0* 7.8* 7.7*  HCT 21.9* 27.1* 25.7* 25.7* 25.4*  MCV 95.2 94.4 92.8 98.1 96.6  PLT 147* 144* 105* 130* 140*    Cardiac Enzymes: No results for input(s): CKTOTAL, CKMB, CKMBINDEX, TROPONINI in the last 168 hours.  BNP (last 3 results) No results for input(s): BNP in the last 8760 hours.  ProBNP (last 3 results) No results for input(s): PROBNP in the last 8760 hours.  Radiological Exams: DG CHEST PORT 1 VIEW  Result Date: 08/28/2020 CLINICAL DATA:  Respiratory failure, pneumonia, ventilator dependent EXAM: PORTABLE CHEST 1 VIEW COMPARISON:  08/24/2020 FINDINGS: Multifocal patchy opacities, right upper lobe predominant. Possible small left pleural effusion. No pneumothorax. This appearance is unchanged. Cardiomegaly. Tracheostomy in satisfactory position. Two right IJ venous catheter is terminating in the right atrium, one of which is new. Enteric tube coursing into the stomach, new. IMPRESSION: Multifocal pneumonia, right upper lobe predominant, unchanged. Possible small left pleural effusion. New right IJ venous catheter and enteric tube. Additional stable support apparatus as above. Electronically Signed   By: 08/26/2020 M.D.   On: 08/28/2020 06:13   DG Abd Portable 1V  Result Date: 08/28/2020 CLINICAL DATA:  Nasogastric tube placement EXAM: PORTABLE ABDOMEN - 1 VIEW COMPARISON:  2020-08-25 FINDINGS: NG tube tip and side port project  within the stomach. Nonobstructive bowel gas pattern. IMPRESSION: NG tube tip and side port in the stomach. Electronically Signed   By: Deatra Robinson M.D.   On: 08/28/2020 00:42    Assessment/Plan Active Problems:   Acute on chronic respiratory failure with hypoxia (HCC)   COVID-19 virus infection   Atrial fibrillation with RVR (HCC)   Acute renal failure due to tubular necrosis (HCC)   Metabolic encephalopathy   1. Acute on chronic  respiratory failure with hypoxia patient currently is on pressure support mode 12-hour goal 2. COVID-19 virus infection in recovery we'll continue with supportive care 3. Atrial fibrillation rate now rate controlled 4. Acute renal failure being seen by nephrology 5. Metabolic encephalopathy no change   I have personally seen and evaluated the patient, evaluated laboratory and imaging results, formulated the assessment and plan and placed orders. The Patient requires high complexity decision making with multiple systems involvement.  Rounds were done with the Respiratory Therapy Director and Staff therapists and discussed with nursing staff also.  Yevonne Pax, MD [facility] Pulmonary Critical Care Medicine Sleep Medicine

## 2020-08-29 NOTE — Progress Notes (Signed)
[facility]  ROUNDING NOTE   Subjective:  Overall azotemia has improved as compared to last week. Patient due for another dialysis session today. Still on the ventilator and remains critically ill overall.   Objective:  Vital signs in last 24 hours:  Temperature 97 pulse 113 respirations 34 blood pressure 95/55  Physical Exam: General:  Critically ill-appearing  Head:  Normocephalic, atraumatic.  Dry oral mucosa  Eyes:  Anicteric  Neck:  Tracheostomy in place  Lungs:   Coarse breath sounds bilateral, vent assisted  Heart:  S1S2 no rubs  Abdomen:   Soft, nontender, bowel sounds present  Extremities:  Marked peripheral edema, weeping from upper extremities  Neurologic:  Awake, alert, not following commands  Skin:  No acute rashes  Access:  Right IJ temporary dialysis catheter    Basic Metabolic Panel: Recent Labs  Lab 08/24/20 0602 08/24/20 0602 08/25/20 0552 08/25/20 0552 08/26/20 0415 08/27/20 0509 08/28/20 0614  NA 144  --  141  --  139 140 140  K 4.9  --  4.7  --  5.0 4.8 4.9  CL 103  --  101  --  99 102 101  CO2 28  --  26  --  28 23 28   GLUCOSE 180*  --  205*  --  195* 131* 123*  BUN 182*  --  159*  --  159* 114* 127*  CREATININE 2.31*  --  2.03*  --  2.04* 1.57* 1.70*  CALCIUM 9.0   < > 8.8*   < > 8.9 8.9 8.8*  MG 2.3  --  2.2  --  2.4 2.2 2.3  PHOS 6.6*  --  5.9*  --  6.3* 5.2* 5.5*   < > = values in this interval not displayed.    Liver Function Tests: Recent Labs  Lab 08/24/20 0602 08/25/20 0552 08/26/20 0415 08/27/20 0509 08/28/20 0614  ALBUMIN 3.0* 3.0* 3.0* 3.0* 2.6*   No results for input(s): LIPASE, AMYLASE in the last 168 hours. No results for input(s): AMMONIA in the last 168 hours.  CBC: Recent Labs  Lab 08/24/20 0602 08/25/20 0552 08/26/20 0415 08/27/20 0509 08/28/20 0614  WBC 11.0* 10.5 14.4* 15.0* 13.6*  HGB 7.3* 6.5* 8.3* 8.0* 7.8*  HCT 25.0* 21.9* 27.1* 25.7* 25.7*  MCV 96.5 95.2 94.4 92.8 98.1  PLT 166 147*  144* 105* 130*    Cardiac Enzymes: No results for input(s): CKTOTAL, CKMB, CKMBINDEX, TROPONINI in the last 168 hours.  BNP: Invalid input(s): POCBNP  CBG: No results for input(s): GLUCAP in the last 168 hours.  Microbiology: Results for orders placed or performed during the hospital encounter of 08/19/2020  Urine Culture     Status: Abnormal   Collection Time: 08/17/20  1:32 PM   Specimen: Urine, Random  Result Value Ref Range Status   Specimen Description URINE, RANDOM  Final   Special Requests   Final    NONE Performed at [facility], [address] [place], [place]    Culture >=100,000 COLONIES/mL PSEUDOMONAS AERUGINOSA (A)  Final   Report Status 08/19/2020 FINAL  Final   Organism ID, Bacteria PSEUDOMONAS AERUGINOSA (A)  Final      Susceptibility   Pseudomonas aeruginosa - MIC*    CEFTAZIDIME 16 INTERMEDIATE Intermediate     CIPROFLOXACIN <=0.25 SENSITIVE Sensitive     GENTAMICIN <=1 SENSITIVE Sensitive     IMIPENEM 1 SENSITIVE Sensitive     PIP/TAZO 16 SENSITIVE Sensitive     * >=  100,000 COLONIES/mL PSEUDOMONAS AERUGINOSA  Culture, blood (Routine X 2) w Reflex to ID Panel     Status: None   Collection Time: 08/17/20  1:54 PM   Specimen: BLOOD LEFT HAND  Result Value Ref Range Status   Specimen Description BLOOD LEFT HAND  Final   Special Requests   Final    BOTTLES DRAWN AEROBIC AND ANAEROBIC Blood Culture adequate volume   Culture   Final    NO GROWTH 5 DAYS Performed at [facility], [address] [place], [place] [address]    Report Status 08/22/2020 FINAL  Final  Culture, blood (Routine X 2) w Reflex to ID Panel     Status: None   Collection Time: 08/17/20  2:15 PM   Specimen: BLOOD LEFT ARM  Result Value Ref Range Status   Specimen Description BLOOD LEFT ARM  Final   Special Requests   Final    BOTTLES DRAWN AEROBIC ONLY Blood Culture results may not be optimal due to an inadequate volume of blood received in culture bottles    Culture   Final    NO GROWTH 5 DAYS Performed at [facility], [address] [place], [place] [address]    Report Status 08/22/2020 FINAL  Final  Culture, respiratory (non-expectorated)     Status: None   Collection Time: 08/17/20  6:14 PM   Specimen: Tracheal Aspirate; Respiratory  Result Value Ref Range Status   Specimen Description TRACHEAL ASPIRATE  Final   Special Requests NONE  Final   Gram Stain   Final    RARE WBC PRESENT, PREDOMINANTLY PMN FEW GRAM NEGATIVE RODS Performed at [facility], [address] [place], [place] [address]    Culture ABUNDANT PSEUDOMONAS AERUGINOSA  Final   Report Status 08/20/2020 FINAL  Final   Organism ID, Bacteria PSEUDOMONAS AERUGINOSA  Final      Susceptibility   Pseudomonas aeruginosa - MIC*    CEFTAZIDIME 4 SENSITIVE Sensitive     CIPROFLOXACIN <=0.25 SENSITIVE Sensitive     GENTAMICIN <=1 SENSITIVE Sensitive     IMIPENEM 1 SENSITIVE Sensitive     PIP/TAZO 8 SENSITIVE Sensitive     CEFEPIME 2 SENSITIVE Sensitive     * ABUNDANT PSEUDOMONAS AERUGINOSA  Culture, Urine     Status: Abnormal   Collection Time: 08/27/20 11:41 AM   Specimen: Urine, Random  Result Value Ref Range Status   Specimen Description URINE, RANDOM  Final   Special Requests   Final    NONE Performed at [facility], [address] [place], [place] [address]    Culture 70,000 COLONIES/mL YEAST (A)  Final   Report Status 08/28/2020 FINAL  Final  Culture, respiratory (non-expectorated)     Status: None (Preliminary result)   Collection Time: 08/27/20 12:10 PM   Specimen: Tracheal Aspirate; Respiratory  Result Value Ref Range Status   Specimen Description TRACHEAL ASPIRATE  Final   Special Requests NONE  Final   Gram Stain   Final    MODERATE WBC PRESENT, PREDOMINANTLY PMN NO ORGANISMS SEEN    Culture   Final    RARE STAPHYLOCOCCUS AUREUS RARE PSEUDOMONAS AERUGINOSA CULTURE REINCUBATED FOR BETTER GROWTH Performed at [facility],  [address] [place], [place] [address]    Report Status PENDING  Incomplete    Coagulation Studies: No results for input(s): LABPROT, INR in the last 72 hours.  Urinalysis: Recent Labs    08/27/20 1730  COLORURINE YELLOW  LABSPEC 1.012  PHURINE 5.0  GLUCOSEU NEGATIVE  HGBUR LARGE*  BILIRUBINUR NEGATIVE  KETONESUR NEGATIVE  PROTEINUR 30*  NITRITE NEGATIVE  LEUKOCYTESUR LARGE*      Imaging: DG CHEST PORT 1 VIEW  Result Date: 08/28/2020 CLINICAL DATA:  Respiratory failure, pneumonia, ventilator dependent EXAM: PORTABLE CHEST 1 VIEW COMPARISON:  08/24/2020 FINDINGS: Multifocal patchy opacities, right upper lobe predominant. Possible small left pleural effusion. No pneumothorax. This appearance is unchanged. Cardiomegaly. Tracheostomy in satisfactory position. Two right IJ venous catheter is terminating in the right atrium, one of which is new. Enteric tube coursing into the stomach, new. IMPRESSION: Multifocal pneumonia, right upper lobe predominant, unchanged. Possible small left pleural effusion. New right IJ venous catheter and enteric tube. Additional stable support apparatus as above. Electronically Signed   By: Charline Bills M.D.   On: 08/28/2020 06:13   DG Abd Portable 1V  Result Date: 08/28/2020 CLINICAL DATA:  Nasogastric tube placement EXAM: PORTABLE ABDOMEN - 1 VIEW COMPARISON:  08/26/2020 FINDINGS: NG tube tip and side port project within the stomach. Nonobstructive bowel gas pattern. IMPRESSION: NG tube tip and side port in the stomach. Electronically Signed   By: Deatra Robinson M.D.   On: 08/28/2020 00:42     Medications:       Assessment/ Plan:  74 y.o. female with a PMHx of recent acute respiratory failure secondary to COVID-19 pneumonia, hypertension, hyperlipidemia, diabetes mellitus type 2, depression, anxiety, obesity, atrial fibrillation, history of MRSA bacteremia and fungal pneumonia, recent acute kidney injury, who was admitted to [facility] on 08/22/2020  for ongoing treatment of acute respiratory failure.   1.  Acute kidney injury.    Patient has undergone 2 dialysis treatments.  We plan for an additional dialysis treatment today.  Azotemia has partially improved.  2.  Hypernatremia.  Resolved at the moment.  Serum sodium 140.  Continue to monitor serum sodium.  3.  Acute respiratory failure.  As we continue ultrafiltration hopefully she can be weaned from the ventilator.  4.  Generalized edema/volume overload.  Continue ultrafiltration efforts with dialysis.     LOS: 0 Dick Hark 10/18/20217:47 AM

## 2020-08-30 DIAGNOSIS — J9621 Acute and chronic respiratory failure with hypoxia: Secondary | ICD-10-CM | POA: Diagnosis not present

## 2020-08-30 DIAGNOSIS — U071 COVID-19: Secondary | ICD-10-CM | POA: Diagnosis not present

## 2020-08-30 DIAGNOSIS — N170 Acute kidney failure with tubular necrosis: Secondary | ICD-10-CM | POA: Diagnosis not present

## 2020-08-30 DIAGNOSIS — I4891 Unspecified atrial fibrillation: Secondary | ICD-10-CM | POA: Diagnosis not present

## 2020-08-30 LAB — CULTURE, RESPIRATORY W GRAM STAIN

## 2020-08-30 NOTE — Progress Notes (Addendum)
Pulmonary Critical Care Medicine [facility] [place]   PULMONARY CRITICAL CARE SERVICE  PROGRESS NOTE  Date of Service: 08/30/2020  PAITYN BALSAM  MVE:[id number]  DOB: [birth year]   DOA: 08/29/20  Referring Physician: Carron Curie, MD  HPI: ALAYNAH SCHUTTER is a 74 y.o. female seen for follow up of Acute on Chronic Respiratory Failure.  Patient is currently on pressure support for 16-hour goal currently requiring 45% FiO2 no distress noted.  Medications: Reviewed on Rounds  Physical Exam:  Vitals: Pulse 75 respirations 34 BP 129/67 O2 sat 95% temp 97.6  Ventilator Settings pressure support 12/7 FiO2 45%  . General: Comfortable at this time . Eyes: Grossly normal lids, irises & conjunctiva . ENT: grossly tongue is normal . Neck: no obvious mass . Cardiovascular: S1 S2 normal no gallop . Respiratory: No rales or rhonchi noted . Abdomen: soft . Skin: no rash seen on limited exam . Musculoskeletal: not rigid . Psychiatric:unable to assess . Neurologic: no seizure no involuntary movements         Lab Data:   Basic Metabolic Panel: Recent Labs  Lab 08/25/20 0552 08/26/20 0415 08/27/20 0509 08/28/20 0614 08/29/20 0703  NA 141 139 140 140 143  K 4.7 5.0 4.8 4.9 4.0  CL 101 99 102 101 101  CO2 26 28 23 28 29   GLUCOSE 205* 195* 131* 123* 171*  BUN 159* 159* 114* 127* 133*  CREATININE 2.03* 2.04* 1.57* 1.70* 1.82*  CALCIUM 8.8* 8.9 8.9 8.8* 8.9  MG 2.2 2.4 2.2 2.3 2.3  PHOS 5.9* 6.3* 5.2* 5.5* 5.2*    ABG: Recent Labs  Lab 08/24/20 0530  PHART 7.343*  PCO2ART 52.5*  PO2ART 71.4*  HCO3 27.7  O2SAT 94.9    Liver Function Tests: Recent Labs  Lab 08/25/20 0552 08/26/20 0415 08/27/20 0509 08/28/20 0614 08/29/20 0703  ALBUMIN 3.0* 3.0* 3.0* 2.6* 2.6*   No results for input(s): LIPASE, AMYLASE in the last 168 hours. No results for input(s): AMMONIA in the last 168 hours.  CBC: Recent Labs  Lab 08/25/20 0552 08/26/20 0415  08/27/20 0509 08/28/20 0614 08/29/20 0703  WBC 10.5 14.4* 15.0* 13.6* 13.5*  HGB 6.5* 8.3* 8.0* 7.8* 7.7*  HCT 21.9* 27.1* 25.7* 25.7* 25.4*  MCV 95.2 94.4 92.8 98.1 96.6  PLT 147* 144* 105* 130* 140*    Cardiac Enzymes: No results for input(s): CKTOTAL, CKMB, CKMBINDEX, TROPONINI in the last 168 hours.  BNP (last 3 results) No results for input(s): BNP in the last 8760 hours.  ProBNP (last 3 results) No results for input(s): PROBNP in the last 8760 hours.  Radiological Exams: No results found.  Assessment/Plan Active Problems:   Acute on chronic respiratory failure with hypoxia (HCC)   COVID-19 virus infection   Atrial fibrillation with RVR (HCC)   Acute renal failure due to tubular necrosis (HCC)   Metabolic encephalopathy   1. Acute on chronic respiratory failure with hypoxia patient will continue on pressure support for 16-hour goal currently satting well at this time no fever distress. 2. COVID-19 virus infection in recovery we'll continue with supportive care 3. Atrial fibrillation rate now rate controlled 4. Acute renal failure being seen by nephrology 5. Metabolic encephalopathy no change   I have personally seen and evaluated the patient, evaluated laboratory and imaging results, formulated the assessment and plan and placed orders. The Patient requires high complexity decision making with multiple systems involvement.  Rounds were done with the Respiratory Therapy Director and Staff therapists and discussed  with nursing staff also.  Allyne Gee, MD [facility] Pulmonary Critical Care Medicine Sleep Medicine

## 2020-08-31 DIAGNOSIS — U071 COVID-19: Secondary | ICD-10-CM | POA: Diagnosis not present

## 2020-08-31 DIAGNOSIS — J9621 Acute and chronic respiratory failure with hypoxia: Secondary | ICD-10-CM | POA: Diagnosis not present

## 2020-08-31 DIAGNOSIS — N170 Acute kidney failure with tubular necrosis: Secondary | ICD-10-CM | POA: Diagnosis not present

## 2020-08-31 DIAGNOSIS — I4891 Unspecified atrial fibrillation: Secondary | ICD-10-CM | POA: Diagnosis not present

## 2020-08-31 LAB — RENAL FUNCTION PANEL
Albumin: 2.7 g/dL — ABNORMAL LOW (ref 3.5–5.0)
Anion gap: 11 (ref 5–15)
BUN: 96 mg/dL — ABNORMAL HIGH (ref 8–23)
CO2: 29 mmol/L (ref 22–32)
Calcium: 8.7 mg/dL — ABNORMAL LOW (ref 8.9–10.3)
Chloride: 98 mmol/L (ref 98–111)
Creatinine, Ser: 1.54 mg/dL — ABNORMAL HIGH (ref 0.44–1.00)
GFR, Estimated: 33 mL/min — ABNORMAL LOW (ref >60)
Glucose, Bld: 151 mg/dL — ABNORMAL HIGH (ref 70–99)
Phosphorus: 4.3 mg/dL (ref 2.5–4.6)
Potassium: 4 mmol/L (ref 3.5–5.1)
Sodium: 138 mmol/L (ref 135–145)

## 2020-08-31 LAB — CBC
HCT: 24.8 % — ABNORMAL LOW (ref 36.0–46.0)
Hemoglobin: 7.3 g/dL — ABNORMAL LOW (ref 12.0–15.0)
MCH: 29.1 pg (ref 26.0–34.0)
MCHC: 29.4 g/dL — ABNORMAL LOW (ref 30.0–36.0)
MCV: 98.8 fL (ref 80.0–100.0)
Platelets: 128 10*3/uL — ABNORMAL LOW (ref 150–400)
RBC: 2.51 MIL/uL — ABNORMAL LOW (ref 3.87–5.11)
RDW: 19.4 % — ABNORMAL HIGH (ref 11.5–15.5)
WBC: 18.1 10*3/uL — ABNORMAL HIGH (ref 4.0–10.5)
nRBC: 0.5 % — ABNORMAL HIGH (ref 0.0–0.2)

## 2020-08-31 LAB — SARS CORONAVIRUS 2 (TAT 6-24 HRS): SARS Coronavirus 2: POSITIVE — AB

## 2020-08-31 NOTE — Progress Notes (Addendum)
Pulmonary Critical Care Medicine [facility] [place]   PULMONARY CRITICAL CARE SERVICE  PROGRESS NOTE  Date of Service: 08/31/2020  Aimee Peck  LFY:[id number]  DOB: 1946-08-27   DOA: 08/31/20  Referring Physician: Carron Curie, MD  HPI: Aimee Peck is a 74 y.o. female seen for follow up of Acute on Chronic Respiratory Failure.  Patient has a 16-hour goal today on pressure support 12/7 FiO2 45% satting well this time no distress.  Medications: Reviewed on Rounds  Physical Exam:  Vitals: Pulse 63 respirations 20 BP 126/75 O2 sat 99% temp 97.0  Ventilator Settings per support 12/7 FiO2 45%  . General: Comfortable at this time . Eyes: Grossly normal lids, irises & conjunctiva . ENT: grossly tongue is normal . Neck: no obvious mass . Cardiovascular: S1 S2 normal no gallop . Respiratory: No rales or rhonchi noted . Abdomen: soft . Skin: no rash seen on limited exam . Musculoskeletal: not rigid . Psychiatric:unable to assess . Neurologic: no seizure no involuntary movements         Lab Data:   Basic Metabolic Panel: Recent Labs  Lab 08/25/20 0552 08/26/20 0415 08/27/20 0509 08/28/20 0614 08/29/20 0703  NA 141 139 140 140 143  K 4.7 5.0 4.8 4.9 4.0  CL 101 99 102 101 101  CO2 26 28 23 28 29   GLUCOSE 205* 195* 131* 123* 171*  BUN 159* 159* 114* 127* 133*  CREATININE 2.03* 2.04* 1.57* 1.70* 1.82*  CALCIUM 8.8* 8.9 8.9 8.8* 8.9  MG 2.2 2.4 2.2 2.3 2.3  PHOS 5.9* 6.3* 5.2* 5.5* 5.2*    ABG: No results for input(s): PHART, PCO2ART, PO2ART, HCO3, O2SAT in the last 168 hours.  Liver Function Tests: Recent Labs  Lab 08/25/20 0552 08/26/20 0415 08/27/20 0509 08/28/20 0614 08/29/20 0703  ALBUMIN 3.0* 3.0* 3.0* 2.6* 2.6*   No results for input(s): LIPASE, AMYLASE in the last 168 hours. No results for input(s): AMMONIA in the last 168 hours.  CBC: Recent Labs  Lab 08/25/20 0552 08/26/20 0415 08/27/20 0509 08/28/20 0614  08/29/20 0703  WBC 10.5 14.4* 15.0* 13.6* 13.5*  HGB 6.5* 8.3* 8.0* 7.8* 7.7*  HCT 21.9* 27.1* 25.7* 25.7* 25.4*  MCV 95.2 94.4 92.8 98.1 96.6  PLT 147* 144* 105* 130* 140*    Cardiac Enzymes: No results for input(s): CKTOTAL, CKMB, CKMBINDEX, TROPONINI in the last 168 hours.  BNP (last 3 results) No results for input(s): BNP in the last 8760 hours.  ProBNP (last 3 results) No results for input(s): PROBNP in the last 8760 hours.  Radiological Exams: No results found.  Assessment/Plan Active Problems:   Acute on chronic respiratory failure with hypoxia (HCC)   COVID-19 virus infection   Atrial fibrillation with RVR (HCC)   Acute renal failure due to tubular necrosis (HCC)   Metabolic encephalopathy   1. Acute on chronic respiratory failure with hypoxia  patient will continue towards goal of 16 hours on pressure support has just been placed on.  We will continue aggressive pulmonary toilet supportive measures. 2. COVID-19 virus infection in recovery we'll continue with supportive care 3. Atrial fibrillation rate now rate controlled 4. Acute renal failure being seen by nephrology 5. Metabolic encephalopathy no change   I have personally seen and evaluated the patient, evaluated laboratory and imaging results, formulated the assessment and plan and placed orders. The Patient requires high complexity decision making with multiple systems involvement.  Rounds were done with the Respiratory Therapy Director and Staff therapists and  discussed with nursing staff also.  Allyne Gee, MD [facility] Pulmonary Critical Care Medicine Sleep Medicine

## 2020-08-31 NOTE — Progress Notes (Signed)
[facility]  ROUNDING NOTE   Subjective:  Urine output only 400 cc over the preceding 24 hours. Overall azotemia has improved with initiation of dialysis. Still on the ventilator.   Objective:  Vital signs in last 24 hours:  Temperature 97 pulse 63 respirations 25 blood pressure 126/70  Physical Exam: General:  Critically ill-appearing  Head:  Normocephalic, atraumatic.  Dry oral mucosa  Eyes:  Anicteric  Neck:  Tracheostomy in place  Lungs:   Coarse breath sounds bilateral, vent assisted  Heart:  S1S2 no rubs  Abdomen:   Soft, nontender, bowel sounds present  Extremities:  Marked peripheral edema, weeping from upper extremities  Neurologic:  Awake, alert, not following commands  Skin:  No acute rashes  Access:  Right IJ temporary dialysis catheter    Basic Metabolic Panel: Recent Labs  Lab 08/25/20 0552 08/25/20 0552 08/26/20 0415 08/26/20 0415 08/27/20 0509 08/28/20 0614 08/29/20 0703  NA 141  --  139  --  140 140 143  K 4.7  --  5.0  --  4.8 4.9 4.0  CL 101  --  99  --  102 101 101  CO2 26  --  28  --  23 28 29   GLUCOSE 205*  --  195*  --  131* 123* 171*  BUN 159*  --  159*  --  114* 127* 133*  CREATININE 2.03*  --  2.04*  --  1.57* 1.70* 1.82*  CALCIUM 8.8*   < > 8.9   < > 8.9 8.8* 8.9  MG 2.2  --  2.4  --  2.2 2.3 2.3  PHOS 5.9*  --  6.3*  --  5.2* 5.5* 5.2*   < > = values in this interval not displayed.    Liver Function Tests: Recent Labs  Lab 08/25/20 0552 08/26/20 0415 08/27/20 0509 08/28/20 0614 08/29/20 0703  ALBUMIN 3.0* 3.0* 3.0* 2.6* 2.6*   No results for input(s): LIPASE, AMYLASE in the last 168 hours. No results for input(s): AMMONIA in the last 168 hours.  CBC: Recent Labs  Lab 08/25/20 0552 08/26/20 0415 08/27/20 0509 08/28/20 0614 08/29/20 0703  WBC 10.5 14.4* 15.0* 13.6* 13.5*  HGB 6.5* 8.3* 8.0* 7.8* 7.7*  HCT 21.9* 27.1* 25.7* 25.7* 25.4*  MCV 95.2 94.4 92.8 98.1 96.6  PLT 147* 144* 105* 130* 140*     Cardiac Enzymes: No results for input(s): CKTOTAL, CKMB, CKMBINDEX, TROPONINI in the last 168 hours.  BNP: Invalid input(s): POCBNP  CBG: No results for input(s): GLUCAP in the last 168 hours.  Microbiology: Results for orders placed or performed during the hospital encounter of 09-08-20  Urine Culture     Status: Abnormal   Collection Time: 08/17/20  1:32 PM   Specimen: Urine, Random  Result Value Ref Range Status   Specimen Description URINE, RANDOM  Final   Special Requests   Final    NONE Performed at [facility], [address] [place], [place]    Culture >=100,000 COLONIES/mL PSEUDOMONAS AERUGINOSA (A)  Final   Report Status 08/19/2020 FINAL  Final   Organism ID, Bacteria PSEUDOMONAS AERUGINOSA (A)  Final      Susceptibility   Pseudomonas aeruginosa - MIC*    CEFTAZIDIME 16 INTERMEDIATE Intermediate     CIPROFLOXACIN <=0.25 SENSITIVE Sensitive     GENTAMICIN <=1 SENSITIVE Sensitive     IMIPENEM 1 SENSITIVE Sensitive     PIP/TAZO 16 SENSITIVE Sensitive     * >=100,000 COLONIES/mL  PSEUDOMONAS AERUGINOSA  Culture, blood (Routine X 2) w Reflex to ID Panel     Status: None   Collection Time: 08/17/20  1:54 PM   Specimen: BLOOD LEFT HAND  Result Value Ref Range Status   Specimen Description BLOOD LEFT HAND  Final   Special Requests   Final    BOTTLES DRAWN AEROBIC AND ANAEROBIC Blood Culture adequate volume   Culture   Final    NO GROWTH 5 DAYS Performed at [facility], [address], [place], [place] [address]    Report Status 08/22/2020 FINAL  Final  Culture, blood (Routine X 2) w Reflex to ID Panel     Status: None   Collection Time: 08/17/20  2:15 PM   Specimen: BLOOD LEFT ARM  Result Value Ref Range Status   Specimen Description BLOOD LEFT ARM  Final   Special Requests   Final    BOTTLES DRAWN AEROBIC ONLY Blood Culture results may not be optimal due to an inadequate volume of blood received in culture bottles   Culture   Final     NO GROWTH 5 DAYS Performed at [facility], [address], [place], [place] [address]    Report Status 08/22/2020 FINAL  Final  Culture, respiratory (non-expectorated)     Status: None   Collection Time: 08/17/20  6:14 PM   Specimen: Tracheal Aspirate; Respiratory  Result Value Ref Range Status   Specimen Description TRACHEAL ASPIRATE  Final   Special Requests NONE  Final   Gram Stain   Final    RARE WBC PRESENT, PREDOMINANTLY PMN FEW GRAM NEGATIVE RODS Performed at [facility], [address], [place], [place] [address]    Culture ABUNDANT PSEUDOMONAS AERUGINOSA  Final   Report Status 08/20/2020 FINAL  Final   Organism ID, Bacteria PSEUDOMONAS AERUGINOSA  Final      Susceptibility   Pseudomonas aeruginosa - MIC*    CEFTAZIDIME 4 SENSITIVE Sensitive     CIPROFLOXACIN <=0.25 SENSITIVE Sensitive     GENTAMICIN <=1 SENSITIVE Sensitive     IMIPENEM 1 SENSITIVE Sensitive     PIP/TAZO 8 SENSITIVE Sensitive     CEFEPIME 2 SENSITIVE Sensitive     * ABUNDANT PSEUDOMONAS AERUGINOSA  Culture, Urine     Status: Abnormal   Collection Time: 08/27/20 11:41 AM   Specimen: Urine, Random  Result Value Ref Range Status   Specimen Description URINE, RANDOM  Final   Special Requests   Final    NONE Performed at [facility], [address], [place], [place] [address]    Culture 70,000 COLONIES/mL YEAST (A)  Final   Report Status 08/28/2020 FINAL  Final  Culture, respiratory (non-expectorated)     Status: None   Collection Time: 08/27/20 12:10 PM   Specimen: Tracheal Aspirate; Respiratory  Result Value Ref Range Status   Specimen Description TRACHEAL ASPIRATE  Final   Special Requests NONE  Final   Gram Stain   Final    MODERATE WBC PRESENT, PREDOMINANTLY PMN NO ORGANISMS SEEN Performed at [facility], [address], [place], [place] [address]    Culture   Final    RARE METHICILLIN RESISTANT STAPHYLOCOCCUS AUREUS RARE PSEUDOMONAS AERUGINOSA    Report Status  08/30/2020 FINAL  Final   Organism ID, Bacteria METHICILLIN RESISTANT STAPHYLOCOCCUS AUREUS  Final   Organism ID, Bacteria PSEUDOMONAS AERUGINOSA  Final      Susceptibility   Methicillin resistant staphylococcus aureus - MIC*  CIPROFLOXACIN >=8 RESISTANT Resistant     ERYTHROMYCIN >=8 RESISTANT Resistant     GENTAMICIN <=0.5 SENSITIVE Sensitive     OXACILLIN >=4 RESISTANT Resistant     TETRACYCLINE <=1 SENSITIVE Sensitive     VANCOMYCIN <=0.5 SENSITIVE Sensitive     TRIMETH/SULFA <=10 SENSITIVE Sensitive     CLINDAMYCIN <=0.25 SENSITIVE Sensitive     RIFAMPIN <=0.5 SENSITIVE Sensitive     Inducible Clindamycin NEGATIVE Sensitive     * RARE METHICILLIN RESISTANT STAPHYLOCOCCUS AUREUS   Pseudomonas aeruginosa - MIC*    CEFTAZIDIME 16 INTERMEDIATE Intermediate     CIPROFLOXACIN 1 SENSITIVE Sensitive     GENTAMICIN <=1 SENSITIVE Sensitive     IMIPENEM 1 SENSITIVE Sensitive     * RARE PSEUDOMONAS AERUGINOSA    Coagulation Studies: No results for input(s): LABPROT, INR in the last 72 hours.  Urinalysis: No results for input(s): COLORURINE, LABSPEC, PHURINE, GLUCOSEU, HGBUR, BILIRUBINUR, KETONESUR, PROTEINUR, UROBILINOGEN, NITRITE, LEUKOCYTESUR in the last 72 hours.  Invalid input(s): APPERANCEUR    Imaging: No results found.   Medications:       Assessment/ Plan:  74 y.o. female with a PMHx of recent acute respiratory failure secondary to COVID-19 pneumonia, hypertension, hyperlipidemia, diabetes mellitus type 2, depression, anxiety, obesity, atrial fibrillation, history of MRSA bacteremia and fungal pneumonia, recent acute kidney injury, who was admitted to [facility] on 08-23-20 for ongoing treatment of acute respiratory failure.   1.  Acute kidney injury.   Patient has tolerated initiation of dialysis well. We will plan for additional dialysis treatment today. Ultrafiltration target 2 kg.  2.  Hypernatremia. Resolved with conservative management.  3.  Acute respiratory  failure. Patient maintained on ventilatory support. Continue ultrafiltration in an effort to help wean from the ventilator.  4.  Generalized edema/volume overload. Ultrafiltration target 2 kg today.     LOS: 0 Lindel Marcell 10/20/202110:49 AM

## 2020-09-01 ENCOUNTER — Telehealth (HOSPITAL_COMMUNITY): Payer: Self-pay

## 2020-09-01 DIAGNOSIS — U071 COVID-19: Secondary | ICD-10-CM | POA: Diagnosis not present

## 2020-09-01 DIAGNOSIS — J9621 Acute and chronic respiratory failure with hypoxia: Secondary | ICD-10-CM | POA: Diagnosis not present

## 2020-09-01 DIAGNOSIS — I4891 Unspecified atrial fibrillation: Secondary | ICD-10-CM | POA: Diagnosis not present

## 2020-09-01 DIAGNOSIS — N170 Acute kidney failure with tubular necrosis: Secondary | ICD-10-CM | POA: Diagnosis not present

## 2020-09-01 LAB — NOVEL CORONAVIRUS, NAA (HOSP ORDER, SEND-OUT TO REF LAB; TAT 18-24 HRS): SARS-CoV-2, NAA: NOT DETECTED

## 2020-09-01 NOTE — Consult Note (Signed)
Infectious Disease Consultation   Aimee Peck  ZOX:[id number]  DOB: [birth year]  DOA: 08/12/2020  Requesting physician: Dr. Sharyon Medicus  Reason for consultation: Antibiotic recommendations  History of Present Illness: Aimee Peck is an 74 y.o. female with medical history significant of hypertension, hyperlipidemia, diabetes, anxiety, depression, chronic pain, obesity, atrial fibrillation who presented to the acute facility because of body aches, shortness of breath.  Patient apparently was found to be positive for Covid.  She received treatment with remdesivir, Decadron, Ticlid.  Patient also apparently had MRSA pneumonia and was treated with IV vancomycin and Zosyn.  However, despite care she continued to decline with worsening respiratory failure.  Patient was intubated.  She was unable to be weaned from the ventilator due to tachypnea, tachycardia and hypoxemia.  Goals of care were discussed with the family who wished to continue with full code and all treatment including tracheostomy and PEG tube.  Patient hospital course was complicated by septic shock requiring pressors.  She also had atrial fibrillation requiring amiodarone, digoxin, she was treated with therapeutic anticoagulation with Lovenox.  She also had mild acute renal failure.  Fungemia with Candida in the blood and MRSA in the sputum treated with antimicrobials.  Due to her complex medical problems she was transferred to [facility] [place].  She has a trach in place.  She previously had UTI with Pseudomonas on 08/17/2020 urine cultures which was treated. Tracheal aspirate cultures from 08/27/2020 showing MRSA, Pseudomonas aeruginosa.  Blood cultures on 08/27/2020 did not show any growth.  Urine cultures from 08/27/2020 showing yeast.   Review of Systems:  Unable to obtain review of systems  Past Medical History: Past Medical History:  Diagnosis Date  . Acute on chronic respiratory failure with hypoxia (HCC)    . Acute renal failure due to tubular necrosis (HCC)   . Atrial fibrillation with RVR (HCC)   . COVID-19 virus infection   . Metabolic encephalopathy   Hypertension, hyperlipidemia, diabetes mellitus type 2, depression, anxiety, obesity  Past Surgical History: Tracheostomy, unable to obtain other surgical history  Allergies: Atorvastatin, pravastatin  Social History: Unable to obtain at this time  Family History: Unable to obtain at this time  Physical Exam: Vitals: Temperature 98.3, pulse 78, respiratory 26, blood pressure 140/87, pulse oximetry 96% on FiO2 45%, PEEP 7 Constitutional: Encephalopathic, opening eyes but not following commands Eyes: PERLA  ENMT: external ears and nose appear normal, Lips appears normal, moist oral mucosa Neck: Trach in place CVS: S1-S2 Respiratory: Rhonchi, no wheezing Abdomen: soft, nondistended, positive bowel sounds  Musculoskeletal: Edema with anasarca Neuro: Encephalopathic, unable to do neuro exam at this time Psych: Unable to assess  Skin: no rashes  Data reviewed:  I have personally reviewed following labs and imaging studies Labs:  CBC: Recent Labs  Lab 08/26/20 0415 08/27/20 0509 08/28/20 0614 08/29/20 0703 08/31/20 1222  WBC 14.4* 15.0* 13.6* 13.5* 18.1*  HGB 8.3* 8.0* 7.8* 7.7* 7.3*  HCT 27.1* 25.7* 25.7* 25.4* 24.8*  MCV 94.4 92.8 98.1 96.6 98.8  PLT 144* 105* 130* 140* 128*    Basic Metabolic Panel: Recent Labs  Lab 08/26/20 0415 08/26/20 0415 08/27/20 0509 08/27/20 0509 08/28/20 0614 08/28/20 0614 08/29/20 0703 08/31/20 1222  NA 139  --  140  --  140  --  143 138  K 5.0   < > 4.8   < > 4.9   < > 4.0 4.0  CL 99  --  102  --  101  --  101 98  CO2 28  --  23  --  28  --  29 29  GLUCOSE 195*  --  131*  --  123*  --  171* 151*  BUN 159*  --  114*  --  127*  --  133* 96*  CREATININE 2.04*  --  1.57*  --  1.70*  --  1.82* 1.54*  CALCIUM 8.9  --  8.9  --  8.8*  --  8.9 8.7*  MG 2.4  --  2.2  --  2.3  --  2.3   --   PHOS 6.3*  --  5.2*  --  5.5*  --  5.2* 4.3   < > = values in this interval not displayed.   GFR CrCl cannot be calculated (Unknown ideal weight.). Liver Function Tests: Recent Labs  Lab 08/26/20 0415 08/27/20 0509 08/28/20 0614 08/29/20 0703 08/31/20 1222  ALBUMIN 3.0* 3.0* 2.6* 2.6* 2.7*   No results for input(s): LIPASE, AMYLASE in the last 168 hours. No results for input(s): AMMONIA in the last 168 hours. Coagulation profile No results for input(s): INR, PROTIME in the last 168 hours.  Cardiac Enzymes: No results for input(s): CKTOTAL, CKMB, CKMBINDEX, TROPONINI in the last 168 hours. BNP: Invalid input(s): POCBNP CBG: No results for input(s): GLUCAP in the last 168 hours. D-Dimer No results for input(s): DDIMER in the last 72 hours. Hgb A1c No results for input(s): HGBA1C in the last 72 hours. Lipid Profile No results for input(s): CHOL, HDL, LDLCALC, TRIG, CHOLHDL, LDLDIRECT in the last 72 hours. Thyroid function studies No results for input(s): TSH, T4TOTAL, T3FREE, THYROIDAB in the last 72 hours.  Invalid input(s): FREET3 Anemia work up No results for input(s): VITAMINB12, FOLATE, FERRITIN, TIBC, IRON, RETICCTPCT in the last 72 hours. Urinalysis    Component Value Date/Time   COLORURINE YELLOW 08/27/2020 1730   APPEARANCEUR CLOUDY (A) 08/27/2020 1730   LABSPEC 1.012 08/27/2020 1730   PHURINE 5.0 08/27/2020 1730   GLUCOSEU NEGATIVE 08/27/2020 1730   HGBUR LARGE (A) 08/27/2020 1730   BILIRUBINUR NEGATIVE 08/27/2020 1730   KETONESUR NEGATIVE 08/27/2020 1730   PROTEINUR 30 (A) 08/27/2020 1730   NITRITE NEGATIVE 08/27/2020 1730   LEUKOCYTESUR LARGE (A) 08/27/2020 1730     Microbiology Recent Results (from the past 240 hour(s))  Culture, Urine     Status: Abnormal   Collection Time: 08/27/20 11:41 AM   Specimen: Urine, Random  Result Value Ref Range Status   Specimen Description URINE, RANDOM  Final   Special Requests   Final    NONE Performed  at [facility], [address] [place], [place] [address]    Culture 70,000 COLONIES/mL YEAST (A)  Final   Report Status 08/28/2020 FINAL  Final  Culture, respiratory (non-expectorated)     Status: None   Collection Time: 08/27/20 12:10 PM   Specimen: Tracheal Aspirate; Respiratory  Result Value Ref Range Status   Specimen Description TRACHEAL ASPIRATE  Final   Special Requests NONE  Final   Gram Stain   Final    MODERATE WBC PRESENT, PREDOMINANTLY PMN NO ORGANISMS SEEN Performed at [facility], [address] [place], [place] [address]    Culture   Final    RARE METHICILLIN RESISTANT STAPHYLOCOCCUS AUREUS RARE PSEUDOMONAS AERUGINOSA    Report Status 08/30/2020 FINAL  Final   Organism ID, Bacteria METHICILLIN RESISTANT STAPHYLOCOCCUS AUREUS  Final   Organism ID, Bacteria  PSEUDOMONAS AERUGINOSA  Final      Susceptibility   Methicillin resistant staphylococcus aureus - MIC*    CIPROFLOXACIN >=8 RESISTANT Resistant     ERYTHROMYCIN >=8 RESISTANT Resistant     GENTAMICIN <=0.5 SENSITIVE Sensitive     OXACILLIN >=4 RESISTANT Resistant     TETRACYCLINE <=1 SENSITIVE Sensitive     VANCOMYCIN <=0.5 SENSITIVE Sensitive     TRIMETH/SULFA <=10 SENSITIVE Sensitive     CLINDAMYCIN <=0.25 SENSITIVE Sensitive     RIFAMPIN <=0.5 SENSITIVE Sensitive     Inducible Clindamycin NEGATIVE Sensitive     * RARE METHICILLIN RESISTANT STAPHYLOCOCCUS AUREUS   Pseudomonas aeruginosa - MIC*    CEFTAZIDIME 16 INTERMEDIATE Intermediate     CIPROFLOXACIN 1 SENSITIVE Sensitive     GENTAMICIN <=1 SENSITIVE Sensitive     IMIPENEM 1 SENSITIVE Sensitive     * RARE PSEUDOMONAS AERUGINOSA  SARS CORONAVIRUS 2 (TAT 6-24 HRS) Nasopharyngeal Nasopharyngeal Swab     Status: Abnormal   Collection Time: 08/31/20  8:20 AM   Specimen: Nasopharyngeal Swab  Result Value Ref Range Status   SARS Coronavirus 2 POSITIVE (A) NEGATIVE Final    Comment: RESULT CALLED TO, READ BACK BY AND VERIFIED WITH: [facility]  13:55 08/31/20 ([email or web address]) (NOTE) SARS-CoV-2 target nucleic acids are DETECTED.  The SARS-CoV-2 RNA is generally detectable in upper and lower respiratory specimens during the acute phase of infection. Positive results are indicative of the presence of SARS-CoV-2 RNA. Clinical correlation with patient history and other diagnostic information is  necessary to determine patient infection status. Positive results do not rule out bacterial infection or co-infection with other viruses.  The expected result is Negative.  Fact Sheet for Patients: HairSlick.nohttps://www.fda.gov/media/138098/download  Fact Sheet for Healthcare Providers: quierodirigir.comhttps://www.fda.gov/media/138095/download  This test is not yet approved or cleared by the [place] FDA and  has been authorized for detection and/or diagnosis of SARS-CoV-2 by FDA under an Emergency Use Authorization (EUA). This EUA will remain  in effect (meaning this test can be used) for  the duration of the COVID-19 declaration under Section 564(b)(1) of the Act, 21 U.S.C. section 360bbb-3(b)(1), unless the authorization is terminated or revoked sooner.   Performed at [facility], [address] [place], [place] [address]   Novel Coronavirus, NAA (Hosp order, Send-out to Ref Lab; TAT 18-24 hrs     Status: None   Collection Time: 08/31/20  2:00 PM   Specimen: Nasopharyngeal Swab; Respiratory  Result Value Ref Range Status   SARS-CoV-2, NAA NOT DETECTED NOT DETECTED Final    Comment: (NOTE) This nucleic acid amplification test was developed and its performance characteristics determined by [facility]. Nucleic acid amplification tests include RT-PCR and TMA. This test has not been FDA cleared or approved. This test has been authorized by FDA under an Emergency Use Authorization (EUA). This test is only authorized for the duration of time the declaration that circumstances exist justifying the authorization of the emergency use of in  vitro diagnostic tests for detection of SARS-CoV-2 virus and/or diagnosis of COVID-19 infection under section 564(b)(1) of the Act, 21 U.S.C. 604VWU-9(W360bbb-3(b) (1), unless the authorization is terminated or revoked sooner. When diagnostic testing is negative, the possibility of a false negative result should be considered in the context of a patient's recent exposures and the presence of clinical signs and symptoms consistent with COVID-19. An individual without symptoms of COVID- 19 and who is not shedding SARS-CoV-2  virus would expect to have a negative (not detected) result  in this assay. Performed At: [facility] [address] [place], [place] [id number] Jolene Schimke MD TM:[id number]    Coronavirus Source NASOPHARYNGEAL  Final    Comment: Performed at [facility], [address], [place], [place] [address]       Inpatient Medications:   Please see MAR  Radiological Exams on Admission: No results found.  Impression/Recommendations Active Problems:   Acute on chronic respiratory failure with hypoxia, ventilator dependent  COVID-19 virus infection Pneumonia with MRSA, Pseudomonas Systemic inflammatory response syndrome Leukocytosis UTI with yeast   Atrial fibrillation with RVR   Acute renal failure due to tubular necrosis  Fluid overload with anasarca  Encephalopathy Thrombocytopenia Dysphagia/protein calorie malnutrition Diabetes mellitus  Acute on chronic respiratory failure with hypoxemia, ventilator dependent: Patient initially presented with COVID-19 infection.  She was treated with remdesivir and Decadron at the outside facility.  Now having secondary bacterial pneumonia with MRSA and Pseudomonas.  Pulmonary following.  Recommend to treat with IV vancomycin, ciprofloxacin.  Plan to treat for duration of 1 week pending improvement.  If not improving then we may need to extend the antibiotic therapy with a plan to treat for a total of 10-14 days pending  improvement.  She also has dysphagia and at risk for aspiration.  Pulmonary following.  COVID-19 infection: As mentioned above she was treated with remdesivir and Decadron at the outside facility.  Here started on hydroxyurea.  Further management per the primary team.  Pneumonia: Respiratory culture showing Pseudomonas, MRSA.  Recommend to treat with IV vancomycin, ciprofloxacin (renally dosed) with a plan to treat for duration of 1 week.  If not improving consider extending antibiotic duration and repeat chest imaging preferably chest CT which can be done without contrast given concern for renal failure.  Sent for repeat respiratory cultures if needed.  Systemic inflammatory response syndrome: Likely secondary to the pneumonia.  She had septic shock with candidemia and bacteremia at the outside facility and received treatment with multiple antibiotics.  Currently blood pressure is stable.  Antibiotics as mentioned above.  However, she is high risk for recurrent sepsis.  If she starts having worsening fevers would recommend to send for repeat pancultures.  Leukocytosis: Antibiotics as mentioned above.  Continue to monitor.  UTI: Urine culture showing yeast.  She also had candidemia at the outside facility.  Started on fluconazole.  We will plan to treat for duration of 1 week.  If she starts having worsening fevers would also recommend to send for blood cultures.  Acute renal failure: In the setting of sepsis with shock, ATN.  Please monitor BUN/creatinine closely while on antibiotics.  Avoid nephrotoxic medications.  Nephrology following.  Atrial fibrillation: Continue medications per primary team.  Volume overload/anasarca: Patient on dialysis per nephrology.  Thrombocytopenia: Patient noted to have low platelets in the setting of infection, systemic inflammatory response syndrome.  Antibiotics as mentioned above.  Please monitor closely.  Encephalopathy: Continue supportive management per the  primary team.  Dysphagia/protein calorie malnutrition: Due to the dysphagia she is high risk for aspiration and aspiration pneumonia.  Management per the primary team.  Diabetes mellitus: Management per primary team.  Unfortunately due to her complex medical problems she is high risk for worsening and decompensation.  Thank you for this consultation.  Plan of care discussed with the primary team and pharmacy.  Vonzella Nipple M.D. 09/01/2020, 6:16 PM

## 2020-09-01 NOTE — Progress Notes (Signed)
Pulmonary Critical Care Medicine [facility]   PULMONARY CRITICAL CARE SERVICE  PROGRESS NOTE  Date of Service: 09/01/2020  Aimee Peck  POE:[id number]  DOB: 28-Mar-1946   DOA: 08/31/2020  Referring Physician: Carron Curie, MD  HPI: Aimee Peck is a 74 y.o. female seen for follow up of Acute on Chronic Respiratory Failure.  Currently resting on the ventilator on assist control mode is supposed to do pressure support today for about 12 hours  Medications: Reviewed on Rounds  Physical Exam:  Vitals: Temperature is 98.1 pulse 65 respiratory rate 21 blood pressure is 135/76 saturations 99%  Ventilator Settings on assist control FiO2 is 45% tidal volume 460 PEEP 7  . General: Comfortable at this time . Eyes: Grossly normal lids, irises & conjunctiva . ENT: grossly tongue is normal . Neck: no obvious mass . Cardiovascular: S1 S2 normal no gallop . Respiratory: No rhonchi no rales . Abdomen: soft . Skin: no rash seen on limited exam . Musculoskeletal: not rigid . Psychiatric:unable to assess . Neurologic: no seizure no involuntary movements         Lab Data:   Basic Metabolic Panel: Recent Labs  Lab 08/26/20 0415 08/27/20 0509 08/28/20 0614 08/29/20 0703 08/31/20 1222  NA 139 140 140 143 138  K 5.0 4.8 4.9 4.0 4.0  CL 99 102 101 101 98  CO2 28 23 28 29 29   GLUCOSE 195* 131* 123* 171* 151*  BUN 159* 114* 127* 133* 96*  CREATININE 2.04* 1.57* 1.70* 1.82* 1.54*  CALCIUM 8.9 8.9 8.8* 8.9 8.7*  MG 2.4 2.2 2.3 2.3  --   PHOS 6.3* 5.2* 5.5* 5.2* 4.3    ABG: No results for input(s): PHART, PCO2ART, PO2ART, HCO3, O2SAT in the last 168 hours.  Liver Function Tests: Recent Labs  Lab 08/26/20 0415 08/27/20 0509 08/28/20 0614 08/29/20 0703 08/31/20 1222  ALBUMIN 3.0* 3.0* 2.6* 2.6* 2.7*   No results for input(s): LIPASE, AMYLASE in the last 168 hours. No results for input(s): AMMONIA in the last 168 hours.  CBC: Recent Labs  Lab  08/26/20 0415 08/27/20 0509 08/28/20 0614 08/29/20 0703 08/31/20 1222  WBC 14.4* 15.0* 13.6* 13.5* 18.1*  HGB 8.3* 8.0* 7.8* 7.7* 7.3*  HCT 27.1* 25.7* 25.7* 25.4* 24.8*  MCV 94.4 92.8 98.1 96.6 98.8  PLT 144* 105* 130* 140* 128*    Cardiac Enzymes: No results for input(s): CKTOTAL, CKMB, CKMBINDEX, TROPONINI in the last 168 hours.  BNP (last 3 results) No results for input(s): BNP in the last 8760 hours.  ProBNP (last 3 results) No results for input(s): PROBNP in the last 8760 hours.  Radiological Exams: No results found.  Assessment/Plan Active Problems:   Acute on chronic respiratory failure with hypoxia (HCC)   COVID-19 virus infection   Atrial fibrillation with RVR (HCC)   Acute renal failure due to tubular necrosis (HCC)   Metabolic encephalopathy   1. Acute on chronic respiratory failure with hypoxia patient is post wound for 12 hours on pressure support today 2. COVID-19 virus infection in recovery we will continue with supportive care 3. Atrial fibrillation with RVR supportive care 4. Acute renal failure being followed by nephrology 5. Metabolic encephalopathy no change we will continue with supportive care   I have personally seen and evaluated the patient, evaluated laboratory and imaging results, formulated the assessment and plan and placed orders. The Patient requires high complexity decision making with multiple systems involvement.  Rounds were done with the Respiratory Therapy Director  and Staff therapists and discussed with nursing staff also.  Allyne Gee, MD [facility] Pulmonary Critical Care Medicine Sleep Medicine

## 2020-09-01 NOTE — Telephone Encounter (Signed)
Called to Discuss with patient about Covid symptoms and the use of the monoclonal antibody infusion for those with mild to moderate Covid symptoms and at a high risk of hospitalization.     Pt appears to qualify for this infusion due to co-morbid conditions and/or a member of an at-risk group in accordance with the FDA Emergency Use Authorization.    Unable to reach pt    

## 2020-09-01 NOTE — Telephone Encounter (Signed)
Called patient to discuss treatment for Covid-19 with monoclonal antibody infusion.  She clearly qualifies for treatment given age and co-morbidities.   No answer, unable to leave voicemail.

## 2020-09-02 DIAGNOSIS — N170 Acute kidney failure with tubular necrosis: Secondary | ICD-10-CM | POA: Diagnosis not present

## 2020-09-02 DIAGNOSIS — U071 COVID-19: Secondary | ICD-10-CM | POA: Diagnosis not present

## 2020-09-02 DIAGNOSIS — I4891 Unspecified atrial fibrillation: Secondary | ICD-10-CM | POA: Diagnosis not present

## 2020-09-02 DIAGNOSIS — J9621 Acute and chronic respiratory failure with hypoxia: Secondary | ICD-10-CM | POA: Diagnosis not present

## 2020-09-02 LAB — CBC
HCT: 24.8 % — ABNORMAL LOW (ref 36.0–46.0)
Hemoglobin: 7.7 g/dL — ABNORMAL LOW (ref 12.0–15.0)
MCH: 30.3 pg (ref 26.0–34.0)
MCHC: 31 g/dL (ref 30.0–36.0)
MCV: 97.6 fL (ref 80.0–100.0)
Platelets: 133 10*3/uL — ABNORMAL LOW (ref 150–400)
RBC: 2.54 MIL/uL — ABNORMAL LOW (ref 3.87–5.11)
RDW: 19.6 % — ABNORMAL HIGH (ref 11.5–15.5)
WBC: 15.5 10*3/uL — ABNORMAL HIGH (ref 4.0–10.5)
nRBC: 0.6 % — ABNORMAL HIGH (ref 0.0–0.2)

## 2020-09-02 LAB — RENAL FUNCTION PANEL
Albumin: 2.5 g/dL — ABNORMAL LOW (ref 3.5–5.0)
Anion gap: 12 (ref 5–15)
BUN: 75 mg/dL — ABNORMAL HIGH (ref 8–23)
CO2: 22 mmol/L (ref 22–32)
Calcium: 8.5 mg/dL — ABNORMAL LOW (ref 8.9–10.3)
Chloride: 98 mmol/L (ref 98–111)
Creatinine, Ser: 1.45 mg/dL — ABNORMAL HIGH (ref 0.44–1.00)
GFR, Estimated: 38 mL/min — ABNORMAL LOW (ref >60)
Glucose, Bld: 100 mg/dL — ABNORMAL HIGH (ref 70–99)
Phosphorus: 4.4 mg/dL (ref 2.5–4.6)
Potassium: 5 mmol/L (ref 3.5–5.1)
Sodium: 132 mmol/L — ABNORMAL LOW (ref 135–145)

## 2020-09-02 LAB — VANCOMYCIN, TROUGH: Vancomycin Tr: 33 ug/mL (ref 15–20)

## 2020-09-02 NOTE — Progress Notes (Signed)
Called to floor rn to see if pt can come down for procedure this afternoon. Floor RN returned my call and states that the unit including RT is very busy and it is unlikely that they will be able to travel down today for pt procedure. I advised that this is not a procedure that will be done on the weekend, she understood and states that the patient does have means to be fed. Floor RN states that she will call back if there is a change of events this afternoon.

## 2020-09-02 NOTE — Progress Notes (Signed)
[facility]  ROUNDING NOTE   Subjective:  Patient seen and evaluated at bedside. Renal parameters have improved with dialysis treatment. Still volume overloaded. Currently on the ventilator.   Objective:  Vital signs in last 24 hours:  Temperature 96 pulse 73 respirations 30 blood pressure 133/94  Physical Exam: General:  Critically ill-appearing  Head:  Normocephalic, atraumatic.  Oral mucosa moist  Eyes:  Anicteric  Neck:  Tracheostomy in place  Lungs:   Coarse breath sounds bilateral, vent assisted  Heart:  S1S2 no rubs  Abdomen:   Soft, nontender, bowel sounds present  Extremities:  3+ upper and lower extremity edema  Neurologic:  Awake, alert, not following commands  Skin:  No acute rashes  Access:  Right IJ temporary dialysis catheter    Basic Metabolic Panel: Recent Labs  Lab 08/27/20 0509 08/27/20 0509 08/28/20 0614 08/28/20 0614 08/29/20 0703 08/31/20 1222 09/02/20 0446  NA 140  --  140  --  143 138 132*  K 4.8  --  4.9  --  4.0 4.0 5.0  CL 102  --  101  --  101 98 98  CO2 23  --  28  --  29 29 22   GLUCOSE 131*  --  123*  --  171* 151* 100*  BUN 114*  --  127*  --  133* 96* 75*  CREATININE 1.57*  --  1.70*  --  1.82* 1.54* 1.45*  CALCIUM 8.9   < > 8.8*   < > 8.9 8.7* 8.5*  MG 2.2  --  2.3  --  2.3  --   --   PHOS 5.2*  --  5.5*  --  5.2* 4.3 4.4   < > = values in this interval not displayed.    Liver Function Tests: Recent Labs  Lab 08/27/20 0509 08/28/20 0614 08/29/20 0703 08/31/20 1222 09/02/20 0446  ALBUMIN 3.0* 2.6* 2.6* 2.7* 2.5*   No results for input(s): LIPASE, AMYLASE in the last 168 hours. No results for input(s): AMMONIA in the last 168 hours.  CBC: Recent Labs  Lab 08/27/20 0509 08/28/20 0614 08/29/20 0703 08/31/20 1222 09/02/20 0446  WBC 15.0* 13.6* 13.5* 18.1* 15.5*  HGB 8.0* 7.8* 7.7* 7.3* 7.7*  HCT 25.7* 25.7* 25.4* 24.8* 24.8*  MCV 92.8 98.1 96.6 98.8 97.6  PLT 105* 130* 140* 128* 133*    Cardiac  Enzymes: No results for input(s): CKTOTAL, CKMB, CKMBINDEX, TROPONINI in the last 168 hours.  BNP: Invalid input(s): POCBNP  CBG: No results for input(s): GLUCAP in the last 168 hours.  Microbiology: Results for orders placed or performed during the hospital encounter of 09/01/2020  Urine Culture     Status: Abnormal   Collection Time: 08/17/20  1:32 PM   Specimen: Urine, Random  Result Value Ref Range Status   Specimen Description URINE, RANDOM  Final   Special Requests   Final    NONE Performed at [facility], [address] [place], [place]    Culture >=100,000 COLONIES/mL PSEUDOMONAS AERUGINOSA (A)  Final   Report Status 08/19/2020 FINAL  Final   Organism ID, Bacteria PSEUDOMONAS AERUGINOSA (A)  Final      Susceptibility   Pseudomonas aeruginosa - MIC*    CEFTAZIDIME 16 INTERMEDIATE Intermediate     CIPROFLOXACIN <=0.25 SENSITIVE Sensitive     GENTAMICIN <=1 SENSITIVE Sensitive     IMIPENEM 1 SENSITIVE Sensitive     PIP/TAZO 16 SENSITIVE Sensitive     * >=100,000  COLONIES/mL PSEUDOMONAS AERUGINOSA  Culture, blood (Routine X 2) w Reflex to ID Panel     Status: None   Collection Time: 08/17/20  1:54 PM   Specimen: BLOOD LEFT HAND  Result Value Ref Range Status   Specimen Description BLOOD LEFT HAND  Final   Special Requests   Final    BOTTLES DRAWN AEROBIC AND ANAEROBIC Blood Culture adequate volume   Culture   Final    NO GROWTH 5 DAYS Performed at [facility], [address], [place], [place] [address]    Report Status 08/22/2020 FINAL  Final  Culture, blood (Routine X 2) w Reflex to ID Panel     Status: None   Collection Time: 08/17/20  2:15 PM   Specimen: BLOOD LEFT ARM  Result Value Ref Range Status   Specimen Description BLOOD LEFT ARM  Final   Special Requests   Final    BOTTLES DRAWN AEROBIC ONLY Blood Culture results may not be optimal due to an inadequate volume of blood received in culture bottles   Culture   Final    NO GROWTH 5  DAYS Performed at [facility], [address], [place], [place] [address]    Report Status 08/22/2020 FINAL  Final  Culture, respiratory (non-expectorated)     Status: None   Collection Time: 08/17/20  6:14 PM   Specimen: Tracheal Aspirate; Respiratory  Result Value Ref Range Status   Specimen Description TRACHEAL ASPIRATE  Final   Special Requests NONE  Final   Gram Stain   Final    RARE WBC PRESENT, PREDOMINANTLY PMN FEW GRAM NEGATIVE RODS Performed at [facility], [address] [place], [place] [address]    Culture ABUNDANT PSEUDOMONAS AERUGINOSA  Final   Report Status 08/20/2020 FINAL  Final   Organism ID, Bacteria PSEUDOMONAS AERUGINOSA  Final      Susceptibility   Pseudomonas aeruginosa - MIC*    CEFTAZIDIME 4 SENSITIVE Sensitive     CIPROFLOXACIN <=0.25 SENSITIVE Sensitive     GENTAMICIN <=1 SENSITIVE Sensitive     IMIPENEM 1 SENSITIVE Sensitive     PIP/TAZO 8 SENSITIVE Sensitive     CEFEPIME 2 SENSITIVE Sensitive     * ABUNDANT PSEUDOMONAS AERUGINOSA  Culture, Urine     Status: Abnormal   Collection Time: 08/27/20 11:41 AM   Specimen: Urine, Random  Result Value Ref Range Status   Specimen Description URINE, RANDOM  Final   Special Requests   Final    NONE Performed at [facility], [address], [place], [place] [address]    Culture 70,000 COLONIES/mL YEAST (A)  Final   Report Status 08/28/2020 FINAL  Final  Culture, respiratory (non-expectorated)     Status: None   Collection Time: 08/27/20 12:10 PM   Specimen: Tracheal Aspirate; Respiratory  Result Value Ref Range Status   Specimen Description TRACHEAL ASPIRATE  Final   Special Requests NONE  Final   Gram Stain   Final    MODERATE WBC PRESENT, PREDOMINANTLY PMN NO ORGANISMS SEEN Performed at [facility] Lab, [address], [place], [place] [address]    Culture   Final    RARE METHICILLIN RESISTANT STAPHYLOCOCCUS AUREUS RARE PSEUDOMONAS AERUGINOSA    Report Status 08/30/2020  FINAL  Final   Organism ID, Bacteria METHICILLIN RESISTANT STAPHYLOCOCCUS AUREUS  Final   Organism ID, Bacteria PSEUDOMONAS AERUGINOSA  Final      Susceptibility   Methicillin resistant staphylococcus aureus -  MIC*    CIPROFLOXACIN >=8 RESISTANT Resistant     ERYTHROMYCIN >=8 RESISTANT Resistant     GENTAMICIN <=0.5 SENSITIVE Sensitive     OXACILLIN >=4 RESISTANT Resistant     TETRACYCLINE <=1 SENSITIVE Sensitive     VANCOMYCIN <=0.5 SENSITIVE Sensitive     TRIMETH/SULFA <=10 SENSITIVE Sensitive     CLINDAMYCIN <=0.25 SENSITIVE Sensitive     RIFAMPIN <=0.5 SENSITIVE Sensitive     Inducible Clindamycin NEGATIVE Sensitive     * RARE METHICILLIN RESISTANT STAPHYLOCOCCUS AUREUS   Pseudomonas aeruginosa - MIC*    CEFTAZIDIME 16 INTERMEDIATE Intermediate     CIPROFLOXACIN 1 SENSITIVE Sensitive     GENTAMICIN <=1 SENSITIVE Sensitive     IMIPENEM 1 SENSITIVE Sensitive     * RARE PSEUDOMONAS AERUGINOSA  SARS CORONAVIRUS 2 (TAT 6-24 HRS) Nasopharyngeal Nasopharyngeal Swab     Status: Abnormal   Collection Time: 08/31/20  8:20 AM   Specimen: Nasopharyngeal Swab  Result Value Ref Range Status   SARS Coronavirus 2 POSITIVE (A) NEGATIVE Final    Comment: RESULT CALLED TO, READ BACK BY AND VERIFIED WITH: [facility] RN 13:55 08/31/20 ([email or web address]) (NOTE) SARS-CoV-2 target nucleic acids are DETECTED.  The SARS-CoV-2 RNA is generally detectable in upper and lower respiratory specimens during the acute phase of infection. Positive results are indicative of the presence of SARS-CoV-2 RNA. Clinical correlation with patient history and other diagnostic information is  necessary to determine patient infection status. Positive results do not rule out bacterial infection or co-infection with other viruses.  The expected result is Negative.  Fact Sheet for Patients: HairSlick.no  Fact Sheet for Healthcare Providers: quierodirigir.com  This test is not yet  approved or cleared by the [place] FDA and  has been authorized for detection and/or diagnosis of SARS-CoV-2 by FDA under an Emergency Use Authorization (EUA). This EUA will remain  in effect (meaning this test can be used) for  the duration of the COVID-19 declaration under Section 564(b)(1) of the Act, 21 U.S.C. section 360bbb-3(b)(1), unless the authorization is terminated or revoked sooner.   Performed at [facility], [address] [place], [place] [address]   Novel Coronavirus, NAA (Hosp order, Send-out to Ref Lab; TAT 18-24 hrs     Status: None   Collection Time: 08/31/20  2:00 PM   Specimen: Nasopharyngeal Swab; Respiratory  Result Value Ref Range Status   SARS-CoV-2, NAA NOT DETECTED NOT DETECTED Final    Comment: (NOTE) This nucleic acid amplification test was developed and its performance characteristics determined by [employer]. Nucleic acid amplification tests include RT-PCR and TMA. This test has not been FDA cleared or approved. This test has been authorized by FDA under an Emergency Use Authorization (EUA). This test is only authorized for the duration of time the declaration that circumstances exist justifying the authorization of the emergency use of in vitro diagnostic tests for detection of SARS-CoV-2 virus and/or diagnosis of COVID-19 infection under section 564(b)(1) of the Act, 21 U.S.C. 889VQX-4(H) (1), unless the authorization is terminated or revoked sooner. When diagnostic testing is negative, the possibility of a false negative result should be considered in the context of a patient's recent exposures and the presence of clinical signs and symptoms consistent with COVID-19. An individual without symptoms of COVID- 19 and who is not shedding SARS-CoV-2  virus would expect to have a negative (not detected) result in this assay. Performed At: [facility] [address] [place], [place] [id number] Jolene Schimke MD  UJ:[id number]    Coronavirus Source NASOPHARYNGEAL  Final    Comment: Performed at [facility], [address], [place], [place] [address]    Coagulation Studies: No results for input(s): LABPROT, INR in the last 72 hours.  Urinalysis: No results for input(s): COLORURINE, LABSPEC, PHURINE, GLUCOSEU, HGBUR, BILIRUBINUR, KETONESUR, PROTEINUR, UROBILINOGEN, NITRITE, LEUKOCYTESUR in the last 72 hours.  Invalid input(s): APPERANCEUR    Imaging: No results found.   Medications:       Assessment/ Plan:  74 y.o. female with a PMHx of recent acute respiratory failure secondary to COVID-19 pneumonia, hypertension, hyperlipidemia, diabetes mellitus type 2, depression, anxiety, obesity, atrial fibrillation, history of MRSA bacteremia and fungal pneumonia, recent acute kidney injury, who was admitted to [facility] on 09/06/2020 for ongoing treatment of acute respiratory failure.   1.  Acute kidney injury.    Maintain the patient on hemodialysis treatments for now.  Azotemia significantly improved with initiation of dialysis.  2.  Hypernatremia.  Serum sodium a bit low at the moment.  Previously had hypernatremia.  3.  Acute respiratory failure.  Continue current ventilatory support with FiO2 40%.  4.  Generalized edema/volume overload.  Continue ultrafiltration with hemodialysis sessions.     LOS: 0 Jamesmichael Shadd 10/22/20217:56 AM

## 2020-09-02 NOTE — Progress Notes (Addendum)
Pulmonary Critical Care Medicine [facility] [place]   PULMONARY CRITICAL CARE SERVICE  PROGRESS NOTE  Date of Service: 09/02/2020  Aimee Peck  FAO:[id number]  DOB: Nov 16, 1945   DOA: 08/14/2020  Referring Physician: Carron Curie, MD  HPI: Aimee Peck is a 74 y.o. female seen for follow up of Acute on Chronic Respiratory Failure.  Patient remains on full support on the vent failed weaning today currently rate of 28 with an FiO2 of 45%.  Medications: Reviewed on Rounds  Physical Exam:  Vitals: Pulse 73 respirations 30 BP 133/94 O2 sat 97% temp 96.0  Ventilator Settings ventilator mode AC VC rate of 28 tidal volume 460 PEEP of 7 FiO2 45%  . General: Comfortable at this time . Eyes: Grossly normal lids, irises & conjunctiva . ENT: grossly tongue is normal . Neck: no obvious mass . Cardiovascular: S1 S2 normal no gallop . Respiratory: No rales or rhonchi noted . Abdomen: soft . Skin: no rash seen on limited exam . Musculoskeletal: not rigid . Psychiatric:unable to assess . Neurologic: no seizure no involuntary movements         Lab Data:   Basic Metabolic Panel: Recent Labs  Lab 08/27/20 0509 08/28/20 0614 08/29/20 0703 08/31/20 1222 09/02/20 0446  NA 140 140 143 138 132*  K 4.8 4.9 4.0 4.0 5.0  CL 102 101 101 98 98  CO2 23 28 29 29 22   GLUCOSE 131* 123* 171* 151* 100*  BUN 114* 127* 133* 96* 75*  CREATININE 1.57* 1.70* 1.82* 1.54* 1.45*  CALCIUM 8.9 8.8* 8.9 8.7* 8.5*  MG 2.2 2.3 2.3  --   --   PHOS 5.2* 5.5* 5.2* 4.3 4.4    ABG: No results for input(s): PHART, PCO2ART, PO2ART, HCO3, O2SAT in the last 168 hours.  Liver Function Tests: Recent Labs  Lab 08/27/20 0509 08/28/20 0614 08/29/20 0703 08/31/20 1222 09/02/20 0446  ALBUMIN 3.0* 2.6* 2.6* 2.7* 2.5*   No results for input(s): LIPASE, AMYLASE in the last 168 hours. No results for input(s): AMMONIA in the last 168 hours.  CBC: Recent Labs  Lab 08/27/20 0509  08/28/20 0614 08/29/20 0703 08/31/20 1222 09/02/20 0446  WBC 15.0* 13.6* 13.5* 18.1* 15.5*  HGB 8.0* 7.8* 7.7* 7.3* 7.7*  HCT 25.7* 25.7* 25.4* 24.8* 24.8*  MCV 92.8 98.1 96.6 98.8 97.6  PLT 105* 130* 140* 128* 133*    Cardiac Enzymes: No results for input(s): CKTOTAL, CKMB, CKMBINDEX, TROPONINI in the last 168 hours.  BNP (last 3 results) No results for input(s): BNP in the last 8760 hours.  ProBNP (last 3 results) No results for input(s): PROBNP in the last 8760 hours.  Radiological Exams: No results found.  Assessment/Plan Active Problems:   Acute on chronic respiratory failure with hypoxia (HCC)   COVID-19 virus infection   Atrial fibrillation with RVR (HCC)   Acute renal failure due to tubular necrosis (HCC)   Metabolic encephalopathy   1. Acute on chronic respiratory failure with hypoxia patient failed wean today remains on full support assist-control mode rate of 28 tidal volume 460 PEEP of 7 FiO2 45% we will continue aggressive pulmonary toilet supportive measures. 2. COVID-19 virus infection in recovery we will continue with supportive care 3. Atrial fibrillation with RVR supportive care 4. Acute renal failure being followed by nephrology 5. Metabolic encephalopathy no change we will continue with supportive care   I have personally seen and evaluated the patient, evaluated laboratory and imaging results, formulated the assessment and plan and  placed orders. The Patient requires high complexity decision making with multiple systems involvement.  Rounds were done with the Respiratory Therapy Director and Staff therapists and discussed with nursing staff also.  Allyne Gee, MD [facility] Pulmonary Critical Care Medicine Sleep Medicine

## 2020-09-03 DIAGNOSIS — I4891 Unspecified atrial fibrillation: Secondary | ICD-10-CM | POA: Diagnosis not present

## 2020-09-03 DIAGNOSIS — U071 COVID-19: Secondary | ICD-10-CM | POA: Diagnosis not present

## 2020-09-03 DIAGNOSIS — N170 Acute kidney failure with tubular necrosis: Secondary | ICD-10-CM | POA: Diagnosis not present

## 2020-09-03 DIAGNOSIS — J9621 Acute and chronic respiratory failure with hypoxia: Secondary | ICD-10-CM | POA: Diagnosis not present

## 2020-09-03 NOTE — Progress Notes (Signed)
Pulmonary Critical Care Medicine [facility] [place]   PULMONARY CRITICAL CARE SERVICE  PROGRESS NOTE  Date of Service: 09/03/2020  Aimee Peck  GBT:[id number]  DOB: 1946/02/07   DOA: August 24, 2020  Referring Physician: Carron Curie, MD  HPI: Aimee Peck is a 74 y.o. female seen for follow up of Acute on Chronic Respiratory Failure.  Patient currently is on pressure support mode 12/7 the weaning goal today is for 8 hours  Medications: Reviewed on Rounds  Physical Exam:  Vitals: Temperature 97.6 pulse 60 respiratory 24 blood pressure is 110/84 saturations 97%  Ventilator Settings on pressure support FiO2 45% pressure 12/7  . General: Comfortable at this time . Eyes: Grossly normal lids, irises & conjunctiva . ENT: grossly tongue is normal . Neck: no obvious mass . Cardiovascular: S1 S2 normal no gallop . Respiratory: No rhonchi very coarse breath sounds . Abdomen: soft . Skin: no rash seen on limited exam . Musculoskeletal: not rigid . Psychiatric:unable to assess . Neurologic: no seizure no involuntary movements         Lab Data:   Basic Metabolic Panel: Recent Labs  Lab 08/28/20 0614 08/29/20 0703 08/31/20 1222 09/02/20 0446  NA 140 143 138 132*  K 4.9 4.0 4.0 5.0  CL 101 101 98 98  CO2 28 29 29 22   GLUCOSE 123* 171* 151* 100*  BUN 127* 133* 96* 75*  CREATININE 1.70* 1.82* 1.54* 1.45*  CALCIUM 8.8* 8.9 8.7* 8.5*  MG 2.3 2.3  --   --   PHOS 5.5* 5.2* 4.3 4.4    ABG: No results for input(s): PHART, PCO2ART, PO2ART, HCO3, O2SAT in the last 168 hours.  Liver Function Tests: Recent Labs  Lab 08/28/20 0614 08/29/20 0703 08/31/20 1222 09/02/20 0446  ALBUMIN 2.6* 2.6* 2.7* 2.5*   No results for input(s): LIPASE, AMYLASE in the last 168 hours. No results for input(s): AMMONIA in the last 168 hours.  CBC: Recent Labs  Lab 08/28/20 0614 08/29/20 0703 08/31/20 1222 09/02/20 0446  WBC 13.6* 13.5* 18.1* 15.5*  HGB 7.8* 7.7* 7.3*  7.7*  HCT 25.7* 25.4* 24.8* 24.8*  MCV 98.1 96.6 98.8 97.6  PLT 130* 140* 128* 133*    Cardiac Enzymes: No results for input(s): CKTOTAL, CKMB, CKMBINDEX, TROPONINI in the last 168 hours.  BNP (last 3 results) No results for input(s): BNP in the last 8760 hours.  ProBNP (last 3 results) No results for input(s): PROBNP in the last 8760 hours.  Radiological Exams: No results found.  Assessment/Plan Active Problems:   Acute on chronic respiratory failure with hypoxia (HCC)   COVID-19 virus infection   Atrial fibrillation with RVR (HCC)   Acute renal failure due to tubular necrosis (HCC)   Metabolic encephalopathy   1. Acute on chronic respiratory failure hypoxia we will continue with pressure support wean 8-hour goal today 2. COVID-19 virus infection in recovery phase 3. Acute renal failure being seen by nephrology 4. Atrial fibrillation rapid ventricular response rate is controlled 5. Metabolic encephalopathy supportive care   I have personally seen and evaluated the patient, evaluated laboratory and imaging results, formulated the assessment and plan and placed orders. The Patient requires high complexity decision making with multiple systems involvement.  Rounds were done with the Respiratory Therapy Director and Staff therapists and discussed with nursing staff also.  09/04/20, MD [facility] Pulmonary Critical Care Medicine Sleep Medicine

## 2020-09-04 DIAGNOSIS — J9621 Acute and chronic respiratory failure with hypoxia: Secondary | ICD-10-CM | POA: Diagnosis not present

## 2020-09-04 DIAGNOSIS — I4891 Unspecified atrial fibrillation: Secondary | ICD-10-CM | POA: Diagnosis not present

## 2020-09-04 DIAGNOSIS — U071 COVID-19: Secondary | ICD-10-CM | POA: Diagnosis not present

## 2020-09-04 DIAGNOSIS — N170 Acute kidney failure with tubular necrosis: Secondary | ICD-10-CM | POA: Diagnosis not present

## 2020-09-04 LAB — VANCOMYCIN, TROUGH: Vancomycin Tr: 30 ug/mL (ref 15–20)

## 2020-09-04 NOTE — Progress Notes (Signed)
Pulmonary Critical Care Medicine [facility] [place]   PULMONARY CRITICAL CARE SERVICE  PROGRESS NOTE  Date of Service: 09/04/2020  Aimee Peck  NLG:[id number]  DOB: 01/30/1946   DOA: 09/10/2020  Referring Physician: Carron Curie, MD  HPI: Aimee Peck is a 74 y.o. female seen for follow up of Acute on Chronic Respiratory Failure.  Patient currently is on pressure support has been on 45% FiO2 the goal today is for 12 hours  Medications: Reviewed on Rounds  Physical Exam:  Vitals: Temperature is 96.4 pulse 72 respiratory 33 blood pressure is 113/65 saturations 99%  Ventilator Settings on pressure support FiO2 45% pressure 12/5  . General: Comfortable at this time . Eyes: Grossly normal lids, irises & conjunctiva . ENT: grossly tongue is normal . Neck: no obvious mass . Cardiovascular: S1 S2 normal no gallop . Respiratory: Scattered rhonchi coarse breath sounds . Abdomen: soft . Skin: no rash seen on limited exam . Musculoskeletal: not rigid . Psychiatric:unable to assess . Neurologic: no seizure no involuntary movements         Lab Data:   Basic Metabolic Panel: Recent Labs  Lab 08/29/20 0703 08/31/20 1222 09/02/20 0446  NA 143 138 132*  K 4.0 4.0 5.0  CL 101 98 98  CO2 29 29 22   GLUCOSE 171* 151* 100*  BUN 133* 96* 75*  CREATININE 1.82* 1.54* 1.45*  CALCIUM 8.9 8.7* 8.5*  MG 2.3  --   --   PHOS 5.2* 4.3 4.4    ABG: No results for input(s): PHART, PCO2ART, PO2ART, HCO3, O2SAT in the last 168 hours.  Liver Function Tests: Recent Labs  Lab 08/29/20 0703 08/31/20 1222 09/02/20 0446  ALBUMIN 2.6* 2.7* 2.5*   No results for input(s): LIPASE, AMYLASE in the last 168 hours. No results for input(s): AMMONIA in the last 168 hours.  CBC: Recent Labs  Lab 08/29/20 0703 08/31/20 1222 09/02/20 0446  WBC 13.5* 18.1* 15.5*  HGB 7.7* 7.3* 7.7*  HCT 25.4* 24.8* 24.8*  MCV 96.6 98.8 97.6  PLT 140* 128* 133*    Cardiac Enzymes: No  results for input(s): CKTOTAL, CKMB, CKMBINDEX, TROPONINI in the last 168 hours.  BNP (last 3 results) No results for input(s): BNP in the last 8760 hours.  ProBNP (last 3 results) No results for input(s): PROBNP in the last 8760 hours.  Radiological Exams: No results found.  Assessment/Plan Active Problems:   Acute on chronic respiratory failure with hypoxia (HCC)   COVID-19 virus infection   Atrial fibrillation with RVR (HCC)   Acute renal failure due to tubular necrosis (HCC)   Metabolic encephalopathy   1. Acute on chronic respiratory failure hypoxia we will continue with the pressure support weaning as ordered goal today is 12 hours 2. COVID-19 virus infection in recovery phase we will continue to monitor 3. Atrial fibrillation with RVR rate is controlled 4. Acute renal failure with tubular necrosis no change continue supportive care 5. Metabolic encephalopathy at baseline   I have personally seen and evaluated the patient, evaluated laboratory and imaging results, formulated the assessment and plan and placed orders. The Patient requires high complexity decision making with multiple systems involvement.  Rounds were done with the Respiratory Therapy Director and Staff therapists and discussed with nursing staff also.  09/04/20, MD [facility] Pulmonary Critical Care Medicine Sleep Medicine

## 2020-09-05 DIAGNOSIS — I4891 Unspecified atrial fibrillation: Secondary | ICD-10-CM | POA: Diagnosis not present

## 2020-09-05 DIAGNOSIS — U071 COVID-19: Secondary | ICD-10-CM | POA: Diagnosis not present

## 2020-09-05 DIAGNOSIS — J9621 Acute and chronic respiratory failure with hypoxia: Secondary | ICD-10-CM | POA: Diagnosis not present

## 2020-09-05 DIAGNOSIS — N170 Acute kidney failure with tubular necrosis: Secondary | ICD-10-CM | POA: Diagnosis not present

## 2020-09-05 LAB — CBC
HCT: 22.8 % — ABNORMAL LOW (ref 36.0–46.0)
Hemoglobin: 7 g/dL — ABNORMAL LOW (ref 12.0–15.0)
MCH: 29.3 pg (ref 26.0–34.0)
MCHC: 30.7 g/dL (ref 30.0–36.0)
MCV: 95.4 fL (ref 80.0–100.0)
Platelets: 137 10*3/uL — ABNORMAL LOW (ref 150–400)
RBC: 2.39 MIL/uL — ABNORMAL LOW (ref 3.87–5.11)
RDW: 19.6 % — ABNORMAL HIGH (ref 11.5–15.5)
WBC: 9.7 10*3/uL (ref 4.0–10.5)
nRBC: 0.2 % (ref 0.0–0.2)

## 2020-09-05 LAB — RENAL FUNCTION PANEL
Albumin: 2.3 g/dL — ABNORMAL LOW (ref 3.5–5.0)
Anion gap: 10 (ref 5–15)
BUN: 66 mg/dL — ABNORMAL HIGH (ref 8–23)
CO2: 27 mmol/L (ref 22–32)
Calcium: 8.5 mg/dL — ABNORMAL LOW (ref 8.9–10.3)
Chloride: 95 mmol/L — ABNORMAL LOW (ref 98–111)
Creatinine, Ser: 1.69 mg/dL — ABNORMAL HIGH (ref 0.44–1.00)
GFR, Estimated: 31 mL/min — ABNORMAL LOW (ref >60)
Glucose, Bld: 102 mg/dL — ABNORMAL HIGH (ref 70–99)
Phosphorus: 3.8 mg/dL (ref 2.5–4.6)
Potassium: 3.5 mmol/L (ref 3.5–5.1)
Sodium: 132 mmol/L — ABNORMAL LOW (ref 135–145)

## 2020-09-05 LAB — PREPARE RBC (CROSSMATCH)

## 2020-09-05 NOTE — Progress Notes (Signed)
Renal and cbc collected and sent to lab via this nurse

## 2020-09-05 NOTE — Progress Notes (Signed)
[facility]  ROUNDING NOTE   Subjective:  Patient still on the ventilator. Due for hemodialysis treatment. Vent settings stable.  Objective:  Vital signs in last 24 hours:  Temperature 96.3 pulse 79 respirations 31 blood pressure 123/76  Physical Exam: General:  Critically ill-appearing  Head:  Normocephalic, atraumatic.  Oral mucosa moist  Eyes:  Anicteric  Neck:  Tracheostomy in place  Lungs:   Coarse breath sounds bilateral, vent assisted  Heart:  S1S2 no rubs  Abdomen:   Soft, nontender, bowel sounds present  Extremities:  3+ upper and lower extremity edema, weeping from upper extremities  Neurologic:  Awake, alert, not following commands  Skin:  No acute rashes  Access:  Right IJ temporary dialysis catheter    Basic Metabolic Panel: Recent Labs  Lab 08/31/20 1222 09/02/20 0446  NA 138 132*  K 4.0 5.0  CL 98 98  CO2 29 22  GLUCOSE 151* 100*  BUN 96* 75*  CREATININE 1.54* 1.45*  CALCIUM 8.7* 8.5*  PHOS 4.3 4.4    Liver Function Tests: Recent Labs  Lab 08/31/20 1222 09/02/20 0446  ALBUMIN 2.7* 2.5*   No results for input(s): LIPASE, AMYLASE in the last 168 hours. No results for input(s): AMMONIA in the last 168 hours.  CBC: Recent Labs  Lab 08/31/20 1222 09/02/20 0446  WBC 18.1* 15.5*  HGB 7.3* 7.7*  HCT 24.8* 24.8*  MCV 98.8 97.6  PLT 128* 133*    Cardiac Enzymes: No results for input(s): CKTOTAL, CKMB, CKMBINDEX, TROPONINI in the last 168 hours.  BNP: Invalid input(s): POCBNP  CBG: No results for input(s): GLUCAP in the last 168 hours.  Microbiology: Results for orders placed or performed during the hospital encounter of 08/23/2020  Urine Culture     Status: Abnormal   Collection Time: 08/17/20  1:32 PM   Specimen: Urine, Random  Result Value Ref Range Status   Specimen Description URINE, RANDOM  Final   Special Requests   Final    NONE Performed at [facility], [address], [place], [place] [address]     Culture >=100,000 COLONIES/mL PSEUDOMONAS AERUGINOSA (A)  Final   Report Status 08/19/2020 FINAL  Final   Organism ID, Bacteria PSEUDOMONAS AERUGINOSA (A)  Final      Susceptibility   Pseudomonas aeruginosa - MIC*    CEFTAZIDIME 16 INTERMEDIATE Intermediate     CIPROFLOXACIN <=0.25 SENSITIVE Sensitive     GENTAMICIN <=1 SENSITIVE Sensitive     IMIPENEM 1 SENSITIVE Sensitive     PIP/TAZO 16 SENSITIVE Sensitive     * >=100,000 COLONIES/mL PSEUDOMONAS AERUGINOSA  Culture, blood (Routine X 2) w Reflex to ID Panel     Status: None   Collection Time: 08/17/20  1:54 PM   Specimen: BLOOD LEFT HAND  Result Value Ref Range Status   Specimen Description BLOOD LEFT HAND  Final   Special Requests   Final    BOTTLES DRAWN AEROBIC AND ANAEROBIC Blood Culture adequate volume   Culture   Final    NO GROWTH 5 DAYS Performed at [facility], [address] [place], [place] [address]    Report Status 08/22/2020 FINAL  Final  Culture, blood (Routine X 2) w Reflex to ID Panel     Status: None   Collection Time: 08/17/20  2:15 PM   Specimen: BLOOD LEFT ARM  Result Value Ref Range Status   Specimen Description BLOOD LEFT ARM  Final   Special Requests   Final  BOTTLES DRAWN AEROBIC ONLY Blood Culture results may not be optimal due to an inadequate volume of blood received in culture bottles   Culture   Final    NO GROWTH 5 DAYS Performed at [facility], [address], [place], [place] [address]    Report Status 08/22/2020 FINAL  Final  Culture, respiratory (non-expectorated)     Status: None   Collection Time: 08/17/20  6:14 PM   Specimen: Tracheal Aspirate; Respiratory  Result Value Ref Range Status   Specimen Description TRACHEAL ASPIRATE  Final   Special Requests NONE  Final   Gram Stain   Final    RARE WBC PRESENT, PREDOMINANTLY PMN FEW GRAM NEGATIVE RODS Performed at [facility], [address], [place], [place] [address]    Culture ABUNDANT PSEUDOMONAS AERUGINOSA   Final   Report Status 08/20/2020 FINAL  Final   Organism ID, Bacteria PSEUDOMONAS AERUGINOSA  Final      Susceptibility   Pseudomonas aeruginosa - MIC*    CEFTAZIDIME 4 SENSITIVE Sensitive     CIPROFLOXACIN <=0.25 SENSITIVE Sensitive     GENTAMICIN <=1 SENSITIVE Sensitive     IMIPENEM 1 SENSITIVE Sensitive     PIP/TAZO 8 SENSITIVE Sensitive     CEFEPIME 2 SENSITIVE Sensitive     * ABUNDANT PSEUDOMONAS AERUGINOSA  Culture, Urine     Status: Abnormal   Collection Time: 08/27/20 11:41 AM   Specimen: Urine, Random  Result Value Ref Range Status   Specimen Description URINE, RANDOM  Final   Special Requests   Final    NONE Performed at [facility], [address] [place], [place] [address]    Culture 70,000 COLONIES/mL YEAST (A)  Final   Report Status 08/28/2020 FINAL  Final  Culture, respiratory (non-expectorated)     Status: None   Collection Time: 08/27/20 12:10 PM   Specimen: Tracheal Aspirate; Respiratory  Result Value Ref Range Status   Specimen Description TRACHEAL ASPIRATE  Final   Special Requests NONE  Final   Gram Stain   Final    MODERATE WBC PRESENT, PREDOMINANTLY PMN NO ORGANISMS SEEN Performed at [facility], [address], [place], [place] [address]    Culture   Final    RARE METHICILLIN RESISTANT STAPHYLOCOCCUS AUREUS RARE PSEUDOMONAS AERUGINOSA    Report Status 08/30/2020 FINAL  Final   Organism ID, Bacteria METHICILLIN RESISTANT STAPHYLOCOCCUS AUREUS  Final   Organism ID, Bacteria PSEUDOMONAS AERUGINOSA  Final      Susceptibility   Methicillin resistant staphylococcus aureus - MIC*    CIPROFLOXACIN >=8 RESISTANT Resistant     ERYTHROMYCIN >=8 RESISTANT Resistant     GENTAMICIN <=0.5 SENSITIVE Sensitive     OXACILLIN >=4 RESISTANT Resistant     TETRACYCLINE <=1 SENSITIVE Sensitive     VANCOMYCIN <=0.5 SENSITIVE Sensitive     TRIMETH/SULFA <=10 SENSITIVE Sensitive     CLINDAMYCIN <=0.25 SENSITIVE Sensitive     RIFAMPIN <=0.5 SENSITIVE  Sensitive     Inducible Clindamycin NEGATIVE Sensitive     * RARE METHICILLIN RESISTANT STAPHYLOCOCCUS AUREUS   Pseudomonas aeruginosa - MIC*    CEFTAZIDIME 16 INTERMEDIATE Intermediate     CIPROFLOXACIN 1 SENSITIVE Sensitive     GENTAMICIN <=1 SENSITIVE Sensitive     IMIPENEM 1 SENSITIVE Sensitive     * RARE PSEUDOMONAS AERUGINOSA  SARS CORONAVIRUS 2 (TAT 6-24 HRS) Nasopharyngeal Nasopharyngeal Swab     Status: Abnormal   Collection Time: 08/31/20  8:20 AM  Specimen: Nasopharyngeal Swab  Result Value Ref Range Status   SARS Coronavirus 2 POSITIVE (A) NEGATIVE Final    Comment: RESULT CALLED TO, READ BACK BY AND VERIFIED WITH: [facility] 13[facility]55 08/31/20 ([email or web address]) (NOTE) SARS-CoV-2 target nucleic acids are DETECTED.  The SARS-CoV-2 RNA is generally detectable in upper and lower respiratory specimens during the acute phase of infection. Positive results are indicative of the presence of SARS-CoV-2 RNA. Clinical correlation with patient history and other diagnostic information is  necessary to determine patient infection status. Positive results do not rule out bacterial infection or co-infection with other viruses.  The expected result is Negative.  Fact Sheet for Patients: HairSlick.nohttps://www.fda.gov/media/138098/download  Fact Sheet for Healthcare Providers: quierodirigir.comhttps://www.fda.gov/media/138095/download  This test is not yet approved or cleared by the [place] FDA and  has been authorized for detection and/or diagnosis of SARS-CoV-2 by FDA under an Emergency Use Authorization (EUA). This EUA will remain  in effect (meaning this test can be used) for  the duration of the COVID-19 declaration under Section 564(b)(1) of the Act, 21 U.S.C. section 360bbb-3(b)(1), unless the authorization is terminated or revoked sooner.   Performed at [facility], [address] [place], [place] [address]   Novel Coronavirus, NAA (Hosp order, Send-out to Ref Lab; TAT 18-24 hrs     Status: None    Collection Time: 08/31/20  2:00 PM   Specimen: Nasopharyngeal Swab; Respiratory  Result Value Ref Range Status   SARS-CoV-2, NAA NOT DETECTED NOT DETECTED Final    Comment: (NOTE) This nucleic acid amplification test was developed and its performance characteristics determined by [facility]. Nucleic acid amplification tests include RT-PCR and TMA. This test has not been FDA cleared or approved. This test has been authorized by FDA under an Emergency Use Authorization (EUA). This test is only authorized for the duration of time the declaration that circumstances exist justifying the authorization of the emergency use of in vitro diagnostic tests for detection of SARS-CoV-2 virus and/or diagnosis of COVID-19 infection under section 564(b)(1) of the Act, 21 U.S.C. 604VWU-9(W360bbb-3(b) (1), unless the authorization is terminated or revoked sooner. When diagnostic testing is negative, the possibility of a false negative result should be considered in the context of a patient's recent exposures and the presence of clinical signs and symptoms consistent with COVID-19. An individual without symptoms of COVID- 19 and who is not shedding SARS-CoV-2  virus would expect to have a negative (not detected) result in this assay. Performed At: [facility] [address] [place], [place] [id number] Jolene SchimkeNagendra Sanjai MD FA:[id number]:[phone]    Coronavirus Source NASOPHARYNGEAL  Final    Comment: Performed at [facility], [address] [place], [place] [address]    Coagulation Studies: No results for input(s): LABPROT, INR in the last 72 hours.  Urinalysis: No results for input(s): COLORURINE, LABSPEC, PHURINE, GLUCOSEU, HGBUR, BILIRUBINUR, KETONESUR, PROTEINUR, UROBILINOGEN, NITRITE, LEUKOCYTESUR in the last 72 hours.  Invalid input(s): APPERANCEUR    Imaging: No results found.   Medications:       Assessment/ Plan:  74 y.o. female with a PMHx of recent acute respiratory  failure secondary to COVID-19 pneumonia, hypertension, hyperlipidemia, diabetes mellitus type 2, depression, anxiety, obesity, atrial fibrillation, history of MRSA bacteremia and fungal pneumonia, recent acute kidney injury, who was admitted to [facility] on 08/22/2020 for ongoing treatment of acute respiratory failure.   1.  Acute kidney injury.    Urine output over the preceding 24 hours was 750 cc.  Still not  enough to maintain off of dialysis.  We will plan for dialysis treatment today.   2.  Hypernatremia.  Repeat serum sodium today.  Last sodium was actually a bit low.  3.  Acute respiratory failure.  Patient maintained on vent support rate has been difficult to wean.  4.  Generalized edema/volume overload.  Ultrafiltration target 1.5 to 2 kg..     LOS: 0 Aimee Peck 10/25/20219:32 AM

## 2020-09-05 NOTE — Progress Notes (Signed)
Attempted to call for pt to come to radiology for procedure. RN states that pt will be in HD until 1430 this afternoon. Advised that I will call back if there is time available for IR to do procedure.

## 2020-09-05 NOTE — Progress Notes (Signed)
Pulmonary Critical Care Medicine [facility] [place]   PULMONARY CRITICAL CARE SERVICE  PROGRESS NOTE  Date of Service: 09/05/2020  Aimee Peck  DTO:[id number]  DOB: [birth year]   DOA: 2020/09/11  Referring Physician: Carron Curie, MD  HPI: Aimee Peck is a 74 y.o. female seen for follow up of Acute on Chronic Respiratory Failure.  Patient currently is on pressure support mode today the goal is for about 8 hours right now is on a pressure of 12/7  Medications: Reviewed on Rounds  Physical Exam:  Vitals: Temperature 96.3 pulse 74 respiratory rate 30 blood pressure is 123/76 saturations 96%  Ventilator Settings on pressure support FiO2 45% pressure 12/7  . General: Comfortable at this time . Eyes: Grossly normal lids, irises & conjunctiva . ENT: grossly tongue is normal . Neck: no obvious mass . Cardiovascular: S1 S2 normal no gallop . Respiratory: Scattered rhonchi expansion is equal . Abdomen: soft . Skin: no rash seen on limited exam . Musculoskeletal: not rigid . Psychiatric:unable to assess . Neurologic: no seizure no involuntary movements         Lab Data:   Basic Metabolic Panel: Recent Labs  Lab 08/31/20 1222 09/02/20 0446  NA 138 132*  K 4.0 5.0  CL 98 98  CO2 29 22  GLUCOSE 151* 100*  BUN 96* 75*  CREATININE 1.54* 1.45*  CALCIUM 8.7* 8.5*  PHOS 4.3 4.4    ABG: No results for input(s): PHART, PCO2ART, PO2ART, HCO3, O2SAT in the last 168 hours.  Liver Function Tests: Recent Labs  Lab 08/31/20 1222 09/02/20 0446  ALBUMIN 2.7* 2.5*   No results for input(s): LIPASE, AMYLASE in the last 168 hours. No results for input(s): AMMONIA in the last 168 hours.  CBC: Recent Labs  Lab 08/31/20 1222 09/02/20 0446  WBC 18.1* 15.5*  HGB 7.3* 7.7*  HCT 24.8* 24.8*  MCV 98.8 97.6  PLT 128* 133*    Cardiac Enzymes: No results for input(s): CKTOTAL, CKMB, CKMBINDEX, TROPONINI in the last 168 hours.  BNP (last 3 results) No  results for input(s): BNP in the last 8760 hours.  ProBNP (last 3 results) No results for input(s): PROBNP in the last 8760 hours.  Radiological Exams: No results found.  Assessment/Plan Active Problems:   Acute on chronic respiratory failure with hypoxia (HCC)   COVID-19 virus infection   Atrial fibrillation with RVR (HCC)   Acute renal failure due to tubular necrosis (HCC)   Metabolic encephalopathy   1. Acute on chronic respiratory failure hypoxia we will continue to wean on pressure support the goal is for 8 hours 2. COVID-19 virus infection in recovery we will continue to follow 3. Atrial fibrillation rate is controlled 4. Acute renal failure tubular necrosis at baseline 5. Metabolic encephalopathy no change   I have personally seen and evaluated the patient, evaluated laboratory and imaging results, formulated the assessment and plan and placed orders. The Patient requires high complexity decision making with multiple systems involvement.  Rounds were done with the Respiratory Therapy Director and Staff therapists and discussed with nursing staff also.  Yevonne Pax, MD [facility] Pulmonary Critical Care Medicine Sleep Medicine

## 2020-09-06 DIAGNOSIS — I4891 Unspecified atrial fibrillation: Secondary | ICD-10-CM | POA: Diagnosis not present

## 2020-09-06 DIAGNOSIS — J9621 Acute and chronic respiratory failure with hypoxia: Secondary | ICD-10-CM | POA: Diagnosis not present

## 2020-09-06 DIAGNOSIS — N170 Acute kidney failure with tubular necrosis: Secondary | ICD-10-CM | POA: Diagnosis not present

## 2020-09-06 DIAGNOSIS — U071 COVID-19: Secondary | ICD-10-CM | POA: Diagnosis not present

## 2020-09-06 LAB — TYPE AND SCREEN
ABO/RH(D): O POS
Antibody Screen: NEGATIVE
Unit division: 0

## 2020-09-06 LAB — CBC
HCT: 24.5 % — ABNORMAL LOW (ref 36.0–46.0)
Hemoglobin: 7.7 g/dL — ABNORMAL LOW (ref 12.0–15.0)
MCH: 30.1 pg (ref 26.0–34.0)
MCHC: 31.4 g/dL (ref 30.0–36.0)
MCV: 95.7 fL (ref 80.0–100.0)
Platelets: 103 10*3/uL — ABNORMAL LOW (ref 150–400)
RBC: 2.56 MIL/uL — ABNORMAL LOW (ref 3.87–5.11)
RDW: 18.9 % — ABNORMAL HIGH (ref 11.5–15.5)
WBC: 7.6 10*3/uL (ref 4.0–10.5)
nRBC: 0.4 % — ABNORMAL HIGH (ref 0.0–0.2)

## 2020-09-06 LAB — BPAM RBC
Blood Product Expiration Date: 202111232359
ISSUE DATE / TIME: 202110251721
Unit Type and Rh: 5100

## 2020-09-06 LAB — VANCOMYCIN, TROUGH: Vancomycin Tr: 18 ug/mL (ref 15–20)

## 2020-09-06 NOTE — Progress Notes (Signed)
   G tube has to be rescheduled for this pt again today. Pt received Eliquis last night with meds.  Discussed with Dr Fredia Sorrow He is willing to move ahead tomorrow if held again at this point. NPO after MN  Also Dr Fredia Sorrow is asking for CT Abd to review this pts anatomy prior to G tube procedure This has been ordered.  I have spoken to [employer] All are agreeable

## 2020-09-06 NOTE — Progress Notes (Signed)
Pulmonary Critical Care Medicine [facility] [place]   PULMONARY CRITICAL CARE SERVICE  PROGRESS NOTE  Date of Service: 09/06/2020  Aimee Peck  BZJ:[id number]  DOB: August 13, 1946   DOA: 2020-08-22  Referring Physician: Carron Curie, MD  HPI: Aimee Peck is a 74 y.o. female seen for follow up of Acute on Chronic Respiratory Failure.  Patient right now is on pressure support mode 12/5 the goal is for 12 hours  Medications: Reviewed on Rounds  Physical Exam:  Vitals: Temperature is 97.2 pulse 74 respiratory 26 blood pressure 143/80 saturations 96%  Ventilator Settings on pressure support FiO2 45% pressure 12/5  . General: Comfortable at this time . Eyes: Grossly normal lids, irises & conjunctiva . ENT: grossly tongue is normal . Neck: no obvious mass . Cardiovascular: S1 S2 normal no gallop . Respiratory: No rhonchi coarse breath sounds . Abdomen: soft . Skin: no rash seen on limited exam . Musculoskeletal: not rigid . Psychiatric:unable to assess . Neurologic: no seizure no involuntary movements         Lab Data:   Basic Metabolic Panel: Recent Labs  Lab 08/31/20 1222 09/02/20 0446 09/05/20 1122  NA 138 132* 132*  K 4.0 5.0 3.5  CL 98 98 95*  CO2 29 22 27   GLUCOSE 151* 100* 102*  BUN 96* 75* 66*  CREATININE 1.54* 1.45* 1.69*  CALCIUM 8.7* 8.5* 8.5*  PHOS 4.3 4.4 3.8    ABG: No results for input(s): PHART, PCO2ART, PO2ART, HCO3, O2SAT in the last 168 hours.  Liver Function Tests: Recent Labs  Lab 08/31/20 1222 09/02/20 0446 09/05/20 1122  ALBUMIN 2.7* 2.5* 2.3*   No results for input(s): LIPASE, AMYLASE in the last 168 hours. No results for input(s): AMMONIA in the last 168 hours.  CBC: Recent Labs  Lab 08/31/20 1222 09/02/20 0446 09/05/20 1122 09/06/20 0506  WBC 18.1* 15.5* 9.7 7.6  HGB 7.3* 7.7* 7.0* 7.7*  HCT 24.8* 24.8* 22.8* 24.5*  MCV 98.8 97.6 95.4 95.7  PLT 128* 133* 137* 103*    Cardiac Enzymes: No results  for input(s): CKTOTAL, CKMB, CKMBINDEX, TROPONINI in the last 168 hours.  BNP (last 3 results) No results for input(s): BNP in the last 8760 hours.  ProBNP (last 3 results) No results for input(s): PROBNP in the last 8760 hours.  Radiological Exams: No results found.  Assessment/Plan Active Problems:   Acute on chronic respiratory failure with hypoxia (HCC)   COVID-19 virus infection   Atrial fibrillation with RVR (HCC)   Acute renal failure due to tubular necrosis (HCC)   Metabolic encephalopathy   1. Acute on chronic respiratory failure hypoxia we will continue with the pressure support wean as tolerated goal is 12 hours.  Continue secretion management pulmonary toilet. 2. COVID-19 virus infection in recovery 3. Atrial fibrillation rate is controlled 4. Acute renal failure tubular necrosis supportive care 5. Metabolic encephalopathy no change   I have personally seen and evaluated the patient, evaluated laboratory and imaging results, formulated the assessment and plan and placed orders. The Patient requires high complexity decision making with multiple systems involvement.  Rounds were done with the Respiratory Therapy Director and Staff therapists and discussed with nursing staff also.  09/08/20, MD [facility] Pulmonary Critical Care Medicine Sleep Medicine

## 2020-09-06 NOTE — Progress Notes (Signed)
Pt floor RN called this RN to inform that pt received eloquis dose last pm (2.5mg  po at 2130). I informed Dr Fredia Sorrow. He states that eloquis needs to be held this evening and pt will need a CT scan to assess anatomy. I passed this along to rad PA.

## 2020-09-07 ENCOUNTER — Other Ambulatory Visit (HOSPITAL_COMMUNITY): Payer: Medicare Other

## 2020-09-07 DIAGNOSIS — N170 Acute kidney failure with tubular necrosis: Secondary | ICD-10-CM | POA: Diagnosis not present

## 2020-09-07 DIAGNOSIS — J9621 Acute and chronic respiratory failure with hypoxia: Secondary | ICD-10-CM | POA: Diagnosis not present

## 2020-09-07 DIAGNOSIS — U071 COVID-19: Secondary | ICD-10-CM | POA: Diagnosis not present

## 2020-09-07 DIAGNOSIS — I4891 Unspecified atrial fibrillation: Secondary | ICD-10-CM | POA: Diagnosis not present

## 2020-09-07 HISTORY — PX: IR GASTROSTOMY TUBE MOD SED: IMG625

## 2020-09-07 LAB — RENAL FUNCTION PANEL
Albumin: 2.4 g/dL — ABNORMAL LOW (ref 3.5–5.0)
Anion gap: 13 (ref 5–15)
BUN: 41 mg/dL — ABNORMAL HIGH (ref 8–23)
CO2: 25 mmol/L (ref 22–32)
Calcium: 8.6 mg/dL — ABNORMAL LOW (ref 8.9–10.3)
Chloride: 97 mmol/L — ABNORMAL LOW (ref 98–111)
Creatinine, Ser: 1.47 mg/dL — ABNORMAL HIGH (ref 0.44–1.00)
GFR, Estimated: 37 mL/min — ABNORMAL LOW (ref >60)
Glucose, Bld: 62 mg/dL — ABNORMAL LOW (ref 70–99)
Phosphorus: 4 mg/dL (ref 2.5–4.6)
Potassium: 3.4 mmol/L — ABNORMAL LOW (ref 3.5–5.1)
Sodium: 135 mmol/L (ref 135–145)

## 2020-09-07 LAB — CBC
HCT: 28.1 % — ABNORMAL LOW (ref 36.0–46.0)
Hemoglobin: 8.7 g/dL — ABNORMAL LOW (ref 12.0–15.0)
MCH: 29.9 pg (ref 26.0–34.0)
MCHC: 31 g/dL (ref 30.0–36.0)
MCV: 96.6 fL (ref 80.0–100.0)
Platelets: 133 10*3/uL — ABNORMAL LOW (ref 150–400)
RBC: 2.91 MIL/uL — ABNORMAL LOW (ref 3.87–5.11)
RDW: 18.8 % — ABNORMAL HIGH (ref 11.5–15.5)
WBC: 8.1 10*3/uL (ref 4.0–10.5)
nRBC: 0.2 % (ref 0.0–0.2)

## 2020-09-07 MED ORDER — MIDAZOLAM HCL 2 MG/2ML IJ SOLN
INTRAMUSCULAR | Status: AC
  Filled 2020-09-07: qty 2

## 2020-09-07 MED ORDER — MIDAZOLAM HCL 2 MG/2ML IJ SOLN
INTRAMUSCULAR | Status: AC | PRN
  Administered 2020-09-07 (×2): 0.5 mg via INTRAVENOUS

## 2020-09-07 MED ORDER — IOHEXOL 300 MG/ML  SOLN
50.0000 mL | Freq: Once | INTRAMUSCULAR | Status: AC | PRN
  Administered 2020-09-07: 20 mL

## 2020-09-07 MED ORDER — CEFAZOLIN (ANCEF) 1 G IV SOLR
INTRAVENOUS | Status: AC | PRN
  Administered 2020-09-07: 2 g

## 2020-09-07 MED ORDER — FENTANYL CITRATE (PF) 100 MCG/2ML IJ SOLN
INTRAMUSCULAR | Status: AC
  Filled 2020-09-07: qty 2

## 2020-09-07 MED ORDER — FENTANYL CITRATE (PF) 100 MCG/2ML IJ SOLN
INTRAMUSCULAR | Status: AC | PRN
Start: 2020-09-07 — End: 2020-09-07
  Administered 2020-09-07: 25 ug via INTRAVENOUS

## 2020-09-07 MED ORDER — LIDOCAINE HCL 1 % IJ SOLN
INTRAMUSCULAR | Status: AC
  Filled 2020-09-07: qty 20

## 2020-09-07 MED ORDER — CEFAZOLIN SODIUM-DEXTROSE 2-4 GM/100ML-% IV SOLN
INTRAVENOUS | Status: AC
  Filled 2020-09-07: qty 100

## 2020-09-07 NOTE — Progress Notes (Signed)
Pulmonary Critical Care Medicine [facility] [place]   PULMONARY CRITICAL CARE SERVICE  PROGRESS NOTE  Date of Service: 09/07/2020  Aimee Peck  IPJ:[id number]  DOB: Jun 26, 1946   DOA: 2020-08-23  Referring Physician: Carron Curie, MD  HPI: Aimee Peck is a 74 y.o. female seen for follow up of Acute on Chronic Respiratory Failure.  Patient currently is on pressure support 12/5  Medications: Reviewed on Rounds  Physical Exam:  Vitals: Temperature 97.1 pulse 117 respiratory rate was 36 blood pressure is 105/59 saturations 96%  Ventilator Settings on pressure support FiO2 is 35% pressure 12/5  . General: Comfortable at this time . Eyes: Grossly normal lids, irises & conjunctiva . ENT: grossly tongue is normal . Neck: no obvious mass . Cardiovascular: S1 S2 normal no gallop . Respiratory: Scattered coarse rhonchi . Abdomen: soft . Skin: no rash seen on limited exam . Musculoskeletal: not rigid . Psychiatric:unable to assess . Neurologic: no seizure no involuntary movements         Lab Data:   Basic Metabolic Panel: Recent Labs  Lab 09/02/20 0446 09/05/20 1122 09/07/20 0446  NA 132* 132* 135  K 5.0 3.5 3.4*  CL 98 95* 97*  CO2 22 27 25   GLUCOSE 100* 102* 62*  BUN 75* 66* 41*  CREATININE 1.45* 1.69* 1.47*  CALCIUM 8.5* 8.5* 8.6*  PHOS 4.4 3.8 4.0    ABG: No results for input(s): PHART, PCO2ART, PO2ART, HCO3, O2SAT in the last 168 hours.  Liver Function Tests: Recent Labs  Lab 09/02/20 0446 09/05/20 1122 09/07/20 0446  ALBUMIN 2.5* 2.3* 2.4*   No results for input(s): LIPASE, AMYLASE in the last 168 hours. No results for input(s): AMMONIA in the last 168 hours.  CBC: Recent Labs  Lab 09/02/20 0446 09/05/20 1122 09/06/20 0506 09/07/20 0446  WBC 15.5* 9.7 7.6 8.1  HGB 7.7* 7.0* 7.7* 8.7*  HCT 24.8* 22.8* 24.5* 28.1*  MCV 97.6 95.4 95.7 96.6  PLT 133* 137* 103* 133*    Cardiac Enzymes: No results for input(s): CKTOTAL,  CKMB, CKMBINDEX, TROPONINI in the last 168 hours.  BNP (last 3 results) No results for input(s): BNP in the last 8760 hours.  ProBNP (last 3 results) No results for input(s): PROBNP in the last 8760 hours.  Radiological Exams: CT ABDOMEN WO CONTRAST  Result Date: 09/07/2020 CLINICAL DATA:  Dysphagia, assess anatomy for gastrostomy placement. Patient is noted have a previous gastrostomy by prior imaging Aug 23, 2020. EXAM: CT ABDOMEN WITHOUT CONTRAST TECHNIQUE: Multidetector CT imaging of the abdomen was performed following the standard protocol without IV contrast. COMPARISON:  08/23/2020 plain radiograph FINDINGS: Lower chest: Extensive bibasilar patchy and nodular airspace opacities compatible with COVID related pneumonia. Very small pleural effusions noted bilaterally. Heart is enlarged. No pericardial effusion. Hepatobiliary: Limited without IV contrast. No large focal hepatic abnormality. No intrahepatic biliary dilatation. Gallstones noted within a collapsed gallbladder. Common bile duct nondilated. Trace amount of perihepatic ascites. Pancreas: No focal abnormality or ductal dilatation. Spleen: Splenic hilar vascular calcifications noted. Spleen is normal in size. Small amount of perisplenic ascites. Adrenals/Urinary Tract: 2.8 cm smoothly marginated round left adrenal nodule, Hounsfield measurements 33. This remains indeterminate but suspect left adrenal adenoma. Right adrenal gland unremarkable. Nonspecific perinephric strandy edema. Left kidney demonstrates no hydronephrosis. Right kidney demonstrates mild hydronephrosis and associated proximal hydroureter. This is incompletely evaluated by CT of the abdomen only. Stomach/Bowel: NG tube within the stomach body. No significant hiatal hernia. Stomach position is in the midline. Stomach anatomy appears  amenable to percutaneous gastrostomy placement. Small single focus of air along the anterior stomach wall, image 29-3 suspect related to the previous  percutaneous gastrostomy tract. Negative for obstruction or dilatation.  No ileus. No intra-abdominal fluid collection, abscess or hematoma. Vascular/Lymphatic: Aorta atherosclerotic. Negative for aneurysm. No retroperitoneal hemorrhage or hematoma. No bulky adenopathy. Other: Intact abdominal wall.  No hernia. Musculoskeletal: Diffuse subcutaneous body edema compatible with anasarca. Degenerative changes noted of the spine. No acute osseous finding. IMPRESSION: Stomach anatomy is amenable to fluoroscopic percutaneous gastrostomy technique. Small focus of air along the anterior gastric wall noted, suspect along the previous percutaneous gastrostomy tract. Extensive bibasilar pneumonia pattern related to COVID and very small bilateral pleural effusions. Small amount of abdominal ascites.  Diffuse body anasarca. Cholelithiasis Indeterminate 2.7 cm left adrenal nodule, suspect adenoma. Unexplained mild right hydroureteronephrosis Aortic Atherosclerosis (ICD10-I70.0). Electronically Signed   By: Judie Petit.  Shick M.D.   On: 09/07/2020 07:54   IR GASTROSTOMY TUBE MOD SED  Result Date: 09/07/2020 INDICATION: 74 year old female with history of COVID-19 pneumonia in prolonged hospital course requiring tracheostomy and percutaneous enteric feeding. Recently accidentally removed indwelling gastrostomy tube. EXAM: PERC PLACEMENT GASTROSTOMY MEDICATIONS: Ancef 2 gm IV; Antibiotics were administered within 1 hour of the procedure. ANESTHESIA/SEDATION: Versed 1 mg IV; Fentanyl 25 mcg IV Moderate Sedation Time:  15 The patient was continuously monitored during the procedure by the interventional radiology nurse under my direct supervision. CONTRAST:  70mL OMNIPAQUE IOHEXOL 300 MG/ML SOLN - administered into the gastric lumen. FLUOROSCOPY TIME:  Fluoroscopy Time: 1 minutes 48 seconds (14 mGy). COMPLICATIONS: None immediate. PROCEDURE: Informed written consent was obtained from the patient after a thorough discussion of the procedural  risks, benefits and alternatives. All questions were addressed. Maximal Sterile Barrier Technique was utilized including caps, mask, sterile gowns, sterile gloves, sterile drape, hand hygiene and skin antiseptic. A timeout was performed prior to the initiation of the procedure. The patient was placed on the procedure table in the supine position. Pre-procedure abdominal film confirmed visualization of the transverse colon. The indwelling gastric decompression tube tip is located in the stomach radiographically. The patient was prepped and draped in usual sterile fashion. The stomach was insufflated with air via the indwelling nasogastric tube. Under fluoroscopy, a puncture site was selected and local analgesia achieved with 1% lidocaine infiltrated subcutaneously. Under fluoroscopic guidance, a gastropexy needle was passed into the stomach and the T-bar suture was released. Entry into the stomach was confirmed with fluoroscopy, aspiration of air, and injection of contrast material. This was repeated with an additional gastropexy suture (for a total of 2 fasteners). At the center of these gastropexy sutures, a dermatotomy was performed. An 18 gauge needle was passed into the stomach at the site of this dermatotomy, and position within the gastric lumen again confirmed under fluoroscopy using aspiration of air and contrast injection. An Amplatz guidewire was passed through this needle and intraluminal placement within the stomach was confirmed by fluoroscopy. The needle was removed. Over the guidewire, the percutaneous tract was dilated using a 10 mm non-compliant balloon. The balloon was deflated, then pushed into the gastric lumen followed in concert by the 20 Fr gastrostomy tube. The retention balloon of the percutaneous gastrostomy tube was inflated with 20 mL of sterile water. The tube was withdrawn until the retention balloon was at the edge of the gastric lumen. The external bumper was brought to the abdominal  wall. Contrast was injected through the gastrostomy tube, confirming intraluminal positioning. The patient tolerated the procedure well  without any immediate post-procedural complications. IMPRESSION: Technically successful placement of 20 Fr gastrostomy tube. Marliss Coots, MD [facility] Radiology Electronically Signed   By: Marliss Coots MD   On: 09/07/2020 13:13    Assessment/Plan Active Problems:   Acute on chronic respiratory failure with hypoxia (HCC)   COVID-19 virus infection   Atrial fibrillation with RVR (HCC)   Acute renal failure due to tubular necrosis (HCC)   Metabolic encephalopathy   1. Acute on chronic respiratory failure with hypoxia patient is on weaning protocol 16-hour goal on pressure support today. 2. COVID-19 virus infection with resolution phase we will continue to follow 3. Atrial fibrillation rate controlled 4. Acute renal failure tubular necrosis at baseline we will continue with present management. 5. Metabolic encephalopathy no change   I have personally seen and evaluated the patient, evaluated laboratory and imaging results, formulated the assessment and plan and placed orders. The Patient requires high complexity decision making with multiple systems involvement.  Rounds were done with the Respiratory Therapy Director and Staff therapists and discussed with nursing staff also.  Yevonne Pax, MD [facility] Pulmonary Critical Care Medicine Sleep Medicine

## 2020-09-07 NOTE — Procedures (Signed)
Interventional Radiology Procedure Note  Procedure: Placement of percutaneous 40F balloon retention gastrostomy tube  Complications: None  Recommendations: - NPO except for sips and chips remainder of today and overnight - Maintain G-tube to LWS until tomorrow morning  - May advance diet as tolerated and begin using tube tomorrow morning - IR will arrange for 2 week t-tack suture removal   Marliss Coots, MD Pager: [phone]

## 2020-09-08 DIAGNOSIS — I4891 Unspecified atrial fibrillation: Secondary | ICD-10-CM | POA: Diagnosis not present

## 2020-09-08 DIAGNOSIS — U071 COVID-19: Secondary | ICD-10-CM | POA: Diagnosis not present

## 2020-09-08 DIAGNOSIS — J9621 Acute and chronic respiratory failure with hypoxia: Secondary | ICD-10-CM | POA: Diagnosis not present

## 2020-09-08 DIAGNOSIS — N170 Acute kidney failure with tubular necrosis: Secondary | ICD-10-CM | POA: Diagnosis not present

## 2020-09-08 NOTE — Progress Notes (Signed)
Pulmonary Critical Care Medicine [facility] [place]   PULMONARY CRITICAL CARE SERVICE  PROGRESS NOTE  Date of Service: 09/08/2020  Aimee Peck  KYH:[id number]  DOB: 11/30/1945   DOA: 08/20/2020  Referring Physician: Carron Curie, MD  HPI: Aimee Peck is a 74 y.o. female seen for follow up of Acute on Chronic Respiratory Failure.  Patient's been resting on pressure support currently has been on 12/5 supposed to do 2 hours of T collar today  Medications: Reviewed on Rounds  Physical Exam:  Vitals: Temperature is 97.0 pulse 77 respiratory rate is 30 blood pressure is 140/78 saturations 94%  Ventilator Settings on pressure support 12/5  . General: Comfortable at this time . Eyes: Grossly normal lids, irises & conjunctiva . ENT: grossly tongue is normal . Neck: no obvious mass . Cardiovascular: S1 S2 normal no gallop . Respiratory: No rales noted at this time . Abdomen: soft . Skin: no rash seen on limited exam . Musculoskeletal: not rigid . Psychiatric:unable to assess . Neurologic: no seizure no involuntary movements         Lab Data:   Basic Metabolic Panel: Recent Labs  Lab 09/02/20 0446 09/05/20 1122 09/07/20 0446  NA 132* 132* 135  K 5.0 3.5 3.4*  CL 98 95* 97*  CO2 22 27 25   GLUCOSE 100* 102* 62*  BUN 75* 66* 41*  CREATININE 1.45* 1.69* 1.47*  CALCIUM 8.5* 8.5* 8.6*  PHOS 4.4 3.8 4.0    ABG: No results for input(s): PHART, PCO2ART, PO2ART, HCO3, O2SAT in the last 168 hours.  Liver Function Tests: Recent Labs  Lab 09/02/20 0446 09/05/20 1122 09/07/20 0446  ALBUMIN 2.5* 2.3* 2.4*   No results for input(s): LIPASE, AMYLASE in the last 168 hours. No results for input(s): AMMONIA in the last 168 hours.  CBC: Recent Labs  Lab 09/02/20 0446 09/05/20 1122 09/06/20 0506 09/07/20 0446  WBC 15.5* 9.7 7.6 8.1  HGB 7.7* 7.0* 7.7* 8.7*  HCT 24.8* 22.8* 24.5* 28.1*  MCV 97.6 95.4 95.7 96.6  PLT 133* 137* 103* 133*     Cardiac Enzymes: No results for input(s): CKTOTAL, CKMB, CKMBINDEX, TROPONINI in the last 168 hours.  BNP (last 3 results) No results for input(s): BNP in the last 8760 hours.  ProBNP (last 3 results) No results for input(s): PROBNP in the last 8760 hours.  Radiological Exams: CT ABDOMEN WO CONTRAST  Result Date: 09/07/2020 CLINICAL DATA:  Dysphagia, assess anatomy for gastrostomy placement. Patient is noted have a previous gastrostomy by prior imaging 09/06/2020. EXAM: CT ABDOMEN WITHOUT CONTRAST TECHNIQUE: Multidetector CT imaging of the abdomen was performed following the standard protocol without IV contrast. COMPARISON:  08/21/2020 plain radiograph FINDINGS: Lower chest: Extensive bibasilar patchy and nodular airspace opacities compatible with COVID related pneumonia. Very small pleural effusions noted bilaterally. Heart is enlarged. No pericardial effusion. Hepatobiliary: Limited without IV contrast. No large focal hepatic abnormality. No intrahepatic biliary dilatation. Gallstones noted within a collapsed gallbladder. Common bile duct nondilated. Trace amount of perihepatic ascites. Pancreas: No focal abnormality or ductal dilatation. Spleen: Splenic hilar vascular calcifications noted. Spleen is normal in size. Small amount of perisplenic ascites. Adrenals/Urinary Tract: 2.8 cm smoothly marginated round left adrenal nodule, Hounsfield measurements 33. This remains indeterminate but suspect left adrenal adenoma. Right adrenal gland unremarkable. Nonspecific perinephric strandy edema. Left kidney demonstrates no hydronephrosis. Right kidney demonstrates mild hydronephrosis and associated proximal hydroureter. This is incompletely evaluated by CT of the abdomen only. Stomach/Bowel: NG tube within the stomach body.  No significant hiatal hernia. Stomach position is in the midline. Stomach anatomy appears amenable to percutaneous gastrostomy placement. Small single focus of air along the  anterior stomach wall, image 29-3 suspect related to the previous percutaneous gastrostomy tract. Negative for obstruction or dilatation.  No ileus. No intra-abdominal fluid collection, abscess or hematoma. Vascular/Lymphatic: Aorta atherosclerotic. Negative for aneurysm. No retroperitoneal hemorrhage or hematoma. No bulky adenopathy. Other: Intact abdominal wall.  No hernia. Musculoskeletal: Diffuse subcutaneous body edema compatible with anasarca. Degenerative changes noted of the spine. No acute osseous finding. IMPRESSION: Stomach anatomy is amenable to fluoroscopic percutaneous gastrostomy technique. Small focus of air along the anterior gastric wall noted, suspect along the previous percutaneous gastrostomy tract. Extensive bibasilar pneumonia pattern related to COVID and very small bilateral pleural effusions. Small amount of abdominal ascites.  Diffuse body anasarca. Cholelithiasis Indeterminate 2.7 cm left adrenal nodule, suspect adenoma. Unexplained mild right hydroureteronephrosis Aortic Atherosclerosis (ICD10-I70.0). Electronically Signed   By: Judie Petit.  Shick M.D.   On: 09/07/2020 07:54   IR GASTROSTOMY TUBE MOD SED  Result Date: 09/07/2020 INDICATION: 74 year old female with history of COVID-19 pneumonia in prolonged hospital course requiring tracheostomy and percutaneous enteric feeding. Recently accidentally removed indwelling gastrostomy tube. EXAM: PERC PLACEMENT GASTROSTOMY MEDICATIONS: Ancef 2 gm IV; Antibiotics were administered within 1 hour of the procedure. ANESTHESIA/SEDATION: Versed 1 mg IV; Fentanyl 25 mcg IV Moderate Sedation Time:  15 The patient was continuously monitored during the procedure by the interventional radiology nurse under my direct supervision. CONTRAST:  62mL OMNIPAQUE IOHEXOL 300 MG/ML SOLN - administered into the gastric lumen. FLUOROSCOPY TIME:  Fluoroscopy Time: 1 minutes 48 seconds (14 mGy). COMPLICATIONS: None immediate. PROCEDURE: Informed written consent was  obtained from the patient after a thorough discussion of the procedural risks, benefits and alternatives. All questions were addressed. Maximal Sterile Barrier Technique was utilized including caps, mask, sterile gowns, sterile gloves, sterile drape, hand hygiene and skin antiseptic. A timeout was performed prior to the initiation of the procedure. The patient was placed on the procedure table in the supine position. Pre-procedure abdominal film confirmed visualization of the transverse colon. The indwelling gastric decompression tube tip is located in the stomach radiographically. The patient was prepped and draped in usual sterile fashion. The stomach was insufflated with air via the indwelling nasogastric tube. Under fluoroscopy, a puncture site was selected and local analgesia achieved with 1% lidocaine infiltrated subcutaneously. Under fluoroscopic guidance, a gastropexy needle was passed into the stomach and the T-bar suture was released. Entry into the stomach was confirmed with fluoroscopy, aspiration of air, and injection of contrast material. This was repeated with an additional gastropexy suture (for a total of 2 fasteners). At the center of these gastropexy sutures, a dermatotomy was performed. An 18 gauge needle was passed into the stomach at the site of this dermatotomy, and position within the gastric lumen again confirmed under fluoroscopy using aspiration of air and contrast injection. An Amplatz guidewire was passed through this needle and intraluminal placement within the stomach was confirmed by fluoroscopy. The needle was removed. Over the guidewire, the percutaneous tract was dilated using a 10 mm non-compliant balloon. The balloon was deflated, then pushed into the gastric lumen followed in concert by the 20 Fr gastrostomy tube. The retention balloon of the percutaneous gastrostomy tube was inflated with 20 mL of sterile water. The tube was withdrawn until the retention balloon was at the edge  of the gastric lumen. The external bumper was brought to the abdominal wall. Contrast was injected  through the gastrostomy tube, confirming intraluminal positioning. The patient tolerated the procedure well without any immediate post-procedural complications. IMPRESSION: Technically successful placement of 20 Fr gastrostomy tube. Marliss Coots, MD [facility] Radiology Electronically Signed   By: Marliss Coots MD   On: 09/07/2020 13:13    Assessment/Plan Active Problems:   Acute on chronic respiratory failure with hypoxia (HCC)   COVID-19 virus infection   Atrial fibrillation with RVR (HCC)   Acute renal failure due to tubular necrosis (HCC)   Metabolic encephalopathy   1. Acute on chronic respiratory failure with hypoxia we will continue with pressure support as tolerated. 2. COVID-19 virus infection in recovery we will continue with supportive care 3. Atrial fibrillation rate is controlled 4. Acute renal failure tubular necrosis labs are at baseline 5. Metabolic encephalopathy no change we will continue to follow along.   I have personally seen and evaluated the patient, evaluated laboratory and imaging results, formulated the assessment and plan and placed orders. The Patient requires high complexity decision making with multiple systems involvement.  Rounds were done with the Respiratory Therapy Director and Staff therapists and discussed with nursing staff also.  Yevonne Pax, MD [facility] Pulmonary Critical Care Medicine Sleep Medicine

## 2020-09-08 NOTE — Progress Notes (Signed)
   G tube placed in IT 10/27  Afeb; +BS NT abd  Site is clean and dry No bleeding  May use now

## 2020-09-09 ENCOUNTER — Other Ambulatory Visit (HOSPITAL_COMMUNITY): Payer: Medicare Other

## 2020-09-09 DIAGNOSIS — U071 COVID-19: Secondary | ICD-10-CM | POA: Diagnosis not present

## 2020-09-09 DIAGNOSIS — I4891 Unspecified atrial fibrillation: Secondary | ICD-10-CM | POA: Diagnosis not present

## 2020-09-09 DIAGNOSIS — N170 Acute kidney failure with tubular necrosis: Secondary | ICD-10-CM | POA: Diagnosis not present

## 2020-09-09 DIAGNOSIS — J9621 Acute and chronic respiratory failure with hypoxia: Secondary | ICD-10-CM | POA: Diagnosis not present

## 2020-09-09 LAB — RENAL FUNCTION PANEL
Albumin: 2.5 g/dL — ABNORMAL LOW (ref 3.5–5.0)
Anion gap: 11 (ref 5–15)
BUN: 37 mg/dL — ABNORMAL HIGH (ref 8–23)
CO2: 27 mmol/L (ref 22–32)
Calcium: 8.4 mg/dL — ABNORMAL LOW (ref 8.9–10.3)
Chloride: 99 mmol/L (ref 98–111)
Creatinine, Ser: 1.45 mg/dL — ABNORMAL HIGH (ref 0.44–1.00)
GFR, Estimated: 38 mL/min — ABNORMAL LOW (ref >60)
Glucose, Bld: 122 mg/dL — ABNORMAL HIGH (ref 70–99)
Phosphorus: 3.7 mg/dL (ref 2.5–4.6)
Potassium: 3.8 mmol/L (ref 3.5–5.1)
Sodium: 137 mmol/L (ref 135–145)

## 2020-09-09 LAB — BLOOD GAS, ARTERIAL
Acid-Base Excess: 2.8 mmol/L — ABNORMAL HIGH (ref 0.0–2.0)
Bicarbonate: 27.6 mmol/L (ref 20.0–28.0)
FIO2: 55
O2 Saturation: 85.3 %
Patient temperature: 37.5
pCO2 arterial: 50.5 mmHg — ABNORMAL HIGH (ref 32.0–48.0)
pH, Arterial: 7.36 (ref 7.350–7.450)
pO2, Arterial: 49.6 mmHg — ABNORMAL LOW (ref 83.0–108.0)

## 2020-09-09 LAB — CBC
HCT: 28.3 % — ABNORMAL LOW (ref 36.0–46.0)
Hemoglobin: 8.6 g/dL — ABNORMAL LOW (ref 12.0–15.0)
MCH: 29.5 pg (ref 26.0–34.0)
MCHC: 30.4 g/dL (ref 30.0–36.0)
MCV: 96.9 fL (ref 80.0–100.0)
Platelets: 128 10*3/uL — ABNORMAL LOW (ref 150–400)
RBC: 2.92 MIL/uL — ABNORMAL LOW (ref 3.87–5.11)
RDW: 18.7 % — ABNORMAL HIGH (ref 11.5–15.5)
WBC: 6.8 10*3/uL (ref 4.0–10.5)
nRBC: 0 % (ref 0.0–0.2)

## 2020-09-09 LAB — ECHOCARDIOGRAM COMPLETE
Area-P 1/2: 3.08 cm2
S' Lateral: 2.1 cm

## 2020-09-09 NOTE — Plan of Care (Cosign Needed)
CHEST TUBE INSERTION  Date/Time: 09/09/2020 10:19 AM Performed by: Loletha Carrow, MD Authorized by: Blane Ohara, MD  Indications: tension pneumothorax  Sedation: Patient sedated: no  Preparation: skin prepped with ChloraPrep Placement location: right lateral Scalpel size: 11 Tube size ([place]): 14Fr. Ultrasound guidance: no Tension pneumothorax heard: yes Tube connected to: suction Drainage characteristics: serosanguinous Drainage amount: 80 ml Suture material: 0 silk Dressing: petrolatum-impregnated gauze and 4x4 sterile gauze Patient tolerance: patient tolerated the procedure well with no immediate complications Comments: Placed a 14 French pigtail catheter using Seldinger technique.     Called by patient's provider, initially informed that patient would be coming to the emergency room for pneumothorax with hypoxia and hypotension.  However, on call back from patient's provider, patient had coded and had obtained ROSC.  Due to concern for tension pneumothorax and new providers or equipment on that floor to intervene, Dr. Jodi Mourning and I were called to bedside.  Performed a needle decompression with a 14-gauge needle in the second intercostal space in the midclavicular line on the right side with a gush of air.  Then proceeded to perform a chest tube as documented as above.  Patient with some improved sats but ongoing hypotension in the setting of recent code.  Rest of care deferred to primary team.  This procedure was supervised by Dr. Blane Ohara, ED attending.

## 2020-09-09 NOTE — Progress Notes (Signed)
PROGRESS NOTE    Aimee Peck  HDQ:[id number] DOB: 1946-01-22 DOA: 09/01/2020  Brief Narrative:  Aimee Peck is an 74 y.o. female with medical history significant of hypertension, hyperlipidemia, diabetes, anxiety, depression, chronic pain, obesity, atrial fibrillation who presented to the acute facility because of body aches, shortness of breath.  Patient found to be positive for Covid.  She received treatment with remdesivir, Decadron, Ticlid.  Patient also apparently had MRSA pneumonia and was treated with IV vancomycin and Zosyn.  However, despite care she continued to decline with worsening respiratory failure.  Patient was intubated.  She was unable to be weaned from the ventilator due to tachypnea, tachycardia and hypoxemia.  Goals of care were discussed with the family who wished to continue with full code and all treatment including tracheostomy and PEG tube. Hospital course was complicated by septic shock requiring pressors.  She also had atrial fibrillation requiring amiodarone, digoxin, she was treated with therapeutic anticoagulation with Lovenox.  She also had mild acute renal failure.  Fungemia with Candida in the blood and MRSA in the sputum treated with antimicrobials.  Due to her complex medical problems she was transferred to [facility].  She previously had UTI with Pseudomonas on 08/17/2020 urine cultures which was treated. Tracheal aspirate cultures from 08/27/2020 showed MRSA, Pseudomonas aeruginosa.  Blood cultures on 08/27/2020 did not show any growth.  Urine cultures from 08/27/2020 showed yeast.  She received treatment with IV vancomycin, ciprofloxacin. 09/09/2020: Patient this morning became acutely short of breath.  She had a chest x-ray that showed large right pneumothorax.  She had emergent needle decompression and chest tube placement. -She is currently on 75% FiO2, PEEP 7.  Assessment & Plan:  Acute on chronic respiratory failure with hypoxia,  ventilator dependent  COVID-19 virus infection Pneumonia with MRSA, Pseudomonas Right tension pneumothorax Systemic inflammatory response syndrome Leukocytosis UTI with yeast   Atrial fibrillation with RVR   Acute renal failure due to tubular necrosis  Fluid overload with anasarca  Encephalopathy Thrombocytopenia Dysphagia/protein calorie malnutrition Diabetes mellitus  Acute on chronic respiratory failure with hypoxemia, ventilator dependent: Patient initially presented with COVID-19 infection.  She was treated with remdesivir and Decadron at the outside facility.    After that had secondary bacterial pneumonia with MRSA and Pseudomonas.  She received treatment with IV vancomycin, ciprofloxacin.  -She improved.  However, this morning she had acute worsening of shortness of breath and chest imaging showed right-sided tension pneumothorax.  She is status post urgent medial compression and chest tube placement.  She has been started on empiric IV vancomycin, cefepime.  Plan to treat for duration of 5 to 7 days. She also has dysphagia and at risk for aspiration.  Pulmonary following.  COVID-19 infection: As mentioned above she was treated with remdesivir and Decadron at the outside facility.  Here treated with hydroxyurea.  Further management per the primary team.  Pneumonia: Respiratory culture showed Pseudomonas, MRSA.    Received treatment with IV vancomycin, ciprofloxacin. Now restarted on empiric antibiotics again as mentioned above.  Plan to treat for a duration of 5 to 7 days pending improvement. if not improving consider sending repeat respiratory cultures if needed.  Systemic inflammatory response syndrome: Likely secondary to the pneumonia.  She had septic shock with candidemia and bacteremia at the outside facility and received treatment with multiple antibiotics. Antibiotics as mentioned above. She is high risk for recurrent sepsis.  If she starts having worsening fevers would  recommend to send for repeat  pancultures.  Leukocytosis: Antibiotics as mentioned above.  Continue to monitor.  UTI: Urine culture showed yeast.  She also had candidemia at the outside facility. Treated with fluconazole.   Acute renal failure: In the setting of sepsis with shock, ATN. Patient on dialysis.  Please monitor BUN/creatinine closely while on antibiotics.  Avoid nephrotoxic medications.  Nephrology following.  Atrial fibrillation: Continue medications per primary team.  Volume overload/anasarca: Patient on dialysis per nephrology.  Thrombocytopenia: Patient noted to have low platelets in the setting of infection, systemic inflammatory response syndrome.  Antibiotics as mentioned above.  Please monitor closely.  Encephalopathy: Continue supportive management per the primary team.  Dysphagia/protein calorie malnutrition: Due to the dysphagia she is high risk for aspiration and aspiration pneumonia.  Management per the primary team.  Diabetes mellitus: Management per primary team.  Unfortunately due to her complex medical problems she is high risk for worsening and decompensation. Plan of care discussed with the primary team and pharmacy.  Subjective: Patient had acute worsening shortness of breath and coded this morning.  Chest x-ray showed large right-sided tension pneumothorax.  She is status post bedside needle compression emergently and chest tube placement.  She is opening eyes but not following commands.  Remains on the ventilator.  Objective: Vitals: Temperature 98.1, pulse 71, respiratory rate 22, pulse oximetry 98% on 75% FiO2, PEEP 7  Examination: Constitutional: Remains encephalopathic, opening eyes but not following commands Eyes: PERLA  ENMT: external ears and nose appear normal, Lips appear normal, moist oral mucosa Neck: Trach in place CVS: S1-S2 Respiratory:  Right-sided chest tube, rhonchi Abdomen: soft, nondistended, positive bowel sounds   Musculoskeletal: Edema with anasarca Neuro: Encephalopathic, unable to do neuro exam at this time Psych: Unable to assess  Skin: no rashes    Data Reviewed: I have personally reviewed following labs and imaging studies  CBC: Recent Labs  Lab 09/05/20 1122 09/06/20 0506 09/07/20 0446 09/09/20 0505  WBC 9.7 7.6 8.1 6.8  HGB 7.0* 7.7* 8.7* 8.6*  HCT 22.8* 24.5* 28.1* 28.3*  MCV 95.4 95.7 96.6 96.9  PLT 137* 103* 133* 128*    Basic Metabolic Panel: Recent Labs  Lab 09/05/20 1122 09/07/20 0446 09/09/20 0505  NA 132* 135 137  K 3.5 3.4* 3.8  CL 95* 97* 99  CO2 27 25 27   GLUCOSE 102* 62* 122*  BUN 66* 41* 37*  CREATININE 1.69* 1.47* 1.45*  CALCIUM 8.5* 8.6* 8.4*  PHOS 3.8 4.0 3.7    GFR: CrCl cannot be calculated (Unknown ideal weight.).  Liver Function Tests: Recent Labs  Lab 09/05/20 1122 09/07/20 0446 09/09/20 0505  ALBUMIN 2.3* 2.4* 2.5*    CBG: No results for input(s): GLUCAP in the last 168 hours.   Recent Results (from the past 240 hour(s))  SARS CORONAVIRUS 2 (TAT 6-24 HRS) Nasopharyngeal Nasopharyngeal Swab     Status: Abnormal   Collection Time: 08/31/20  8:20 AM   Specimen: Nasopharyngeal Swab  Result Value Ref Range Status   SARS Coronavirus 2 POSITIVE (A) NEGATIVE Final    Comment: RESULT CALLED TO, READ BACK BY AND VERIFIED WITH: [facility] RN 13:55 08/31/20 ([email or web address]) (NOTE) SARS-CoV-2 target nucleic acids are DETECTED.  The SARS-CoV-2 RNA is generally detectable in upper and lower respiratory specimens during the acute phase of infection. Positive results are indicative of the presence of SARS-CoV-2 RNA. Clinical correlation with patient history and other diagnostic information is  necessary to determine patient infection status. Positive results do not rule out bacterial infection or co-infection with  other viruses.  The expected result is Negative.  Fact Sheet for Patients: HairSlick.nohttps://www.fda.gov/media/138098/download  Fact Sheet for  Healthcare Providers: quierodirigir.comhttps://www.fda.gov/media/138095/download  This test is not yet approved or cleared by the [place] FDA and  has been authorized for detection and/or diagnosis of SARS-CoV-2 by FDA under an Emergency Use Authorization (EUA). This EUA will remain  in effect (meaning this test can be used) for  the duration of the COVID-19 declaration under Section 564(b)(1) of the Act, 21 U.S.C. section 360bbb-3(b)(1), unless the authorization is terminated or revoked sooner.   Performed at [facility], [address] [place], [place] [address]   Novel Coronavirus, NAA (Hosp order, Send-out to Ref Lab; TAT 18-24 hrs     Status: None   Collection Time: 08/31/20  2:00 PM   Specimen: Nasopharyngeal Swab; Respiratory  Result Value Ref Range Status   SARS-CoV-2, NAA NOT DETECTED NOT DETECTED Final    Comment: (NOTE) This nucleic acid amplification test was developed and its performance characteristics determined by [facility]. Nucleic acid amplification tests include RT-PCR and TMA. This test has not been FDA cleared or approved. This test has been authorized by FDA under an Emergency Use Authorization (EUA). This test is only authorized for the duration of time the declaration that circumstances exist justifying the authorization of the emergency use of in vitro diagnostic tests for detection of SARS-CoV-2 virus and/or diagnosis of COVID-19 infection under section 564(b)(1) of the Act, 21 U.S.C. 811BJY-7(W360bbb-3(b) (1), unless the authorization is terminated or revoked sooner. When diagnostic testing is negative, the possibility of a false negative result should be considered in the context of a patient's recent exposures and the presence of clinical signs and symptoms consistent with COVID-19. An individual without symptoms of COVID- 19 and who is not shedding SARS-CoV-2  virus would expect to have a negative (not detected) result in this assay. Performed At: [facility] [address] [place], [place] [id number] Jolene SchimkeNagendra Sanjai MD MV:[id number]:[phone]    Coronavirus Source NASOPHARYNGEAL  Final    Comment: Performed at [facility], [address] [place], [place] [address]  Culture, blood (routine x 2)     Status: None (Preliminary result)   Collection Time: 09/09/20 11:29 AM   Specimen: BLOOD RIGHT HAND  Result Value Ref Range Status   Specimen Description BLOOD RIGHT HAND  Final   Special Requests   Final    BOTTLES DRAWN AEROBIC ONLY Blood Culture results may not be optimal due to an inadequate volume of blood received in culture bottles   Culture   Final    NO GROWTH <12 HOURS Performed at [facility], [address] [place], [place] [address]    Report Status PENDING  Incomplete  Culture, blood (routine x 2)     Status: None (Preliminary result)   Collection Time: 09/09/20 11:34 AM   Specimen: BLOOD LEFT HAND  Result Value Ref Range Status   Specimen Description BLOOD LEFT HAND  Final   Special Requests   Final    BOTTLES DRAWN AEROBIC ONLY Blood Culture results may not be optimal due to an inadequate volume of blood received in culture bottles   Culture   Final    NO GROWTH <12 HOURS Performed at [facility], [address] [place], [place] [address]    Report Status PENDING  Incomplete         Radiology Studies: DG CHEST PORT 1 VIEW  Result Date: 09/09/2020  CLINICAL DATA:  Chest tube placed for pneumothorax EXAM: PORTABLE CHEST 1 VIEW COMPARISON:  September 09, 2020 study obtained earlier in the day FINDINGS: Chest tube now present on the right inferiorly with resolution of pneumothorax. Apparent minimal pneumothorax remains in the right base. Extensive subcutaneous air noted on the right. Tracheostomy catheter tip is 4.5 cm above the carina. Central catheter tip is in the right atrium slightly beyond the cavoatrial junction. There is diffuse interstitial and patchy alveolar pulmonary edema. There is  cardiomegaly with pulmonary venous hypertension. No adenopathy. There is air seen to the left of midline, likely representing a degree of pneumomediastinum. Air within the pericardium is possible. IMPRESSION: Chest tube placed with only minimal residual pneumothorax in the right base remaining. Pneumothorax otherwise has resolved. Extensive subcutaneous air noted. Focus of air to the left of midline may represent a degree of pneumomediastinum. It is difficult to exclude air within the pericardium. Close clinical and imaging surveillance in this regard advised. Widespread pulmonary edema remains with cardiomegaly and pulmonary vascular congestion. Suspect underlying congestive heart failure. Tube and catheter positions as described. Electronically Signed   By: Bretta Bang III M.D.   On: 09/09/2020 10:30   DG CHEST PORT 1 VIEW  Result Date: 09/09/2020 CLINICAL DATA:  Acute respiratory distress EXAM: PORTABLE CHEST 1 VIEW COMPARISON:  08/28/2020 FINDINGS: New large right pneumothorax with partial atelectasis of the right lung superimposed on underlying right lung opacities. There is mild leftward mediastinal shift. Persistent and increased left lung opacities. Stable heart size. No pleural effusion. Tracheostomy device and right IJ central line are present. IMPRESSION: New large right pneumothorax with partial atelectasis of the right lung and suspected tension component. Increased left lung opacities. Critical Value/emergent results were called by telephone at the time of interpretation on 09/09/2020 at 9:05 am to provider CHUN LI , who verbally acknowledged these results. Electronically Signed   By: Guadlupe Spanish M.D.   On: 09/09/2020 09:07   ECHOCARDIOGRAM COMPLETE  Result Date: 09/09/2020    ECHOCARDIOGRAM REPORT   Patient Name:   Aimee Peck [facility] Date of Exam: 09/09/2020 Medical Rec #:  [id number]          Height: Accession #:    [id number]         Weight: Date of Birth:  1946-05-28           BSA:  Patient Age:    74 years           BP:           130/79 mmHg Patient Gender: F                  HR:           73 bpm. Exam Location:  Inpatient Procedure: 2D Echo Indications:     Cardiac Arrest  History:         Patient has no prior history of Echocardiogram examinations.                  Arrythmias:Atrial Fibrillation.  Sonographer:     Thurman Coyer RDCS (AE) Referring Phys:  [id number] Orpah Cobb Diagnosing Phys: Orpah Cobb MD IMPRESSIONS  1. Left ventricular ejection fraction, by estimation, is 55 to 60%. The left ventricle has normal function. The left ventricle demonstrates regional wall motion abnormalities (see scoring diagram/findings for description). Left ventricular diastolic parameters are consistent with Grade II diastolic dysfunction (pseudonormalization). There is mild hypokinesis of the left ventricular, basal-mid inferior wall.  2.  Right ventricular systolic function is mildly reduced. The right ventricular size is normal.  3. Left atrial size was moderately dilated.  4. Right atrial size was moderately dilated.  5. No evidence of pneumopericardium.  6. The mitral valve is normal in structure. Mild mitral valve regurgitation.  7. Tricuspid valve regurgitation is moderate.  8. The aortic valve is tricuspid. There is mild calcification of the aortic valve. There is mild thickening of the aortic valve. Aortic valve regurgitation is trivial. Mild aortic valve sclerosis is present, with no evidence of aortic valve stenosis.  9. There is mild (Grade II) atheroma plaque involving the aortic root and ascending aorta. FINDINGS  Left Ventricle: Left ventricular ejection fraction, by estimation, is 55 to 60%. The left ventricle has normal function. The left ventricle demonstrates regional wall motion abnormalities. Mild hypokinesis of the left ventricular, basal-mid inferior wall. The left ventricular internal cavity size was normal in size. There is no left ventricular hypertrophy. Left ventricular  diastolic parameters are consistent with Grade II diastolic dysfunction (pseudonormalization).  LV Wall Scoring: The inferior wall and posterior wall are hypokinetic. The entire anterior wall, antero-lateral wall, entire septum, and entire apex are normal. Right Ventricle: The right ventricular size is normal. No increase in right ventricular wall thickness. Right ventricular systolic function is mildly reduced. Left Atrium: Left atrial size was moderately dilated. Right Atrium: Right atrial size was moderately dilated. Pericardium: No evidence of pneumopericardium. There is no evidence of pericardial effusion. Mitral Valve: The mitral valve is normal in structure. Mild mitral valve regurgitation. Tricuspid Valve: The tricuspid valve is normal in structure. Tricuspid valve regurgitation is moderate. Aortic Valve: The aortic valve is tricuspid. There is mild calcification of the aortic valve. There is mild thickening of the aortic valve. There is mild to moderate aortic valve annular calcification. Aortic valve regurgitation is trivial. Mild aortic valve sclerosis is present, with no evidence of aortic valve stenosis. Pulmonic Valve: The pulmonic valve was normal in structure. Pulmonic valve regurgitation is trivial. Aorta: The aortic root is normal in size and structure. There is mild (Grade II) atheroma plaque involving the aortic root and ascending aorta. Venous: The inferior vena cava was not well visualized. IAS/Shunts: The interatrial septum was not assessed.  LEFT VENTRICLE PLAX 2D LVIDd:         3.20 cm  Diastology LVIDs:         2.10 cm  LV e' medial:    5.11 cm/s LV PW:         1.00 cm  LV E/e' medial:  18.2 LV IVS:        0.90 cm  LV e' lateral:   6.16 cm/s LVOT diam:     2.10 cm  LV E/e' lateral: 15.1 LV SV:         71 LVOT Area:     3.46 cm  RIGHT VENTRICLE RV S prime:     9.89 cm/s TAPSE (M-mode): 1.7 cm LEFT ATRIUM             RIGHT ATRIUM LA diam:        4.10 cm RA Area:     17.50 cm LA Vol (A2C):    58.0 ml RA Volume:   42.60 ml LA Vol (A4C):   55.9 ml LA Biplane Vol: 58.3 ml  AORTIC VALVE LVOT Vmax:   117.00 cm/s LVOT Vmean:  74.300 cm/s LVOT VTI:    0.206 m  AORTA Ao Root diam: 2.70 cm MITRAL VALVE  TRICUSPID VALVE MV Area (PHT): 3.08 cm    TR Peak grad:   43.0 mmHg MV Decel Time: 246 msec    TR Vmax:        328.00 cm/s MV E velocity: 93.10 cm/s MV A velocity: 76.20 cm/s  SHUNTS MV E/A ratio:  1.22        Systemic VTI:  0.21 m                            Systemic Diam: 2.10 cm Orpah Cobb MD Electronically signed by Orpah Cobb MD Signature Date/Time: 09/09/2020/4:15:16 PM    Final     Scheduled Meds: Please see MAR    Vonzella Nipple, MD  09/09/2020, 4:32 PM

## 2020-09-09 NOTE — Progress Notes (Signed)
[facility]  ROUNDING NOTE   Subjective:  Patient with significant decompensation today. She developed tension pneumothorax on the right side. Patient had a code called earlier. We canceled dialysis given instability.  Objective:  Vital signs in last 24 hours:  Temperature 96.3 pulse 79 respirations 31 blood pressure 123/76  Physical Exam: General:  Critically ill-appearing  Head:  Normocephalic, atraumatic.  Oral mucosa moist  Eyes:  Anicteric  Neck:  Tracheostomy in place  Lungs:   Coarse breath sounds bilateral, vent assisted  Heart:  S1S2 no rubs  Abdomen:   Soft, nontender, bowel sounds present  Extremities:  2+ upper and lower extremity edema  Neurologic:  Currently on the ventilator.  Not following commands  Skin:  No acute rashes  Access:  Right IJ temporary dialysis catheter    Basic Metabolic Panel: Recent Labs  Lab 09/05/20 1122 09/07/20 0446 09/09/20 0505  NA 132* 135 137  K 3.5 3.4* 3.8  CL 95* 97* 99  CO2 27 25 27   GLUCOSE 102* 62* 122*  BUN 66* 41* 37*  CREATININE 1.69* 1.47* 1.45*  CALCIUM 8.5* 8.6* 8.4*  PHOS 3.8 4.0 3.7    Liver Function Tests: Recent Labs  Lab 09/05/20 1122 09/07/20 0446 09/09/20 0505  ALBUMIN 2.3* 2.4* 2.5*   No results for input(s): LIPASE, AMYLASE in the last 168 hours. No results for input(s): AMMONIA in the last 168 hours.  CBC: Recent Labs  Lab 09/05/20 1122 09/06/20 0506 09/07/20 0446 09/09/20 0505  WBC 9.7 7.6 8.1 6.8  HGB 7.0* 7.7* 8.7* 8.6*  HCT 22.8* 24.5* 28.1* 28.3*  MCV 95.4 95.7 96.6 96.9  PLT 137* 103* 133* 128*    Cardiac Enzymes: No results for input(s): CKTOTAL, CKMB, CKMBINDEX, TROPONINI in the last 168 hours.  BNP: Invalid input(s): POCBNP  CBG: No results for input(s): GLUCAP in the last 168 hours.  Microbiology: Results for orders placed or performed during the hospital encounter of 04-Feb-2020  Urine Culture     Status: Abnormal   Collection Time: 08/17/20  1:32 PM    Specimen: Urine, Random  Result Value Ref Range Status   Specimen Description URINE, RANDOM  Final   Special Requests   Final    NONE Performed at [facility], [address] [place], [place] [address]    Culture >=100,000 COLONIES/mL PSEUDOMONAS AERUGINOSA (A)  Final   Report Status 08/19/2020 FINAL  Final   Organism ID, Bacteria PSEUDOMONAS AERUGINOSA (A)  Final      Susceptibility   Pseudomonas aeruginosa - MIC*    CEFTAZIDIME 16 INTERMEDIATE Intermediate     CIPROFLOXACIN <=0.25 SENSITIVE Sensitive     GENTAMICIN <=1 SENSITIVE Sensitive     IMIPENEM 1 SENSITIVE Sensitive     PIP/TAZO 16 SENSITIVE Sensitive     * >=100,000 COLONIES/mL PSEUDOMONAS AERUGINOSA  Culture, blood (Routine X 2) w Reflex to ID Panel     Status: None   Collection Time: 08/17/20  1:54 PM   Specimen: BLOOD LEFT HAND  Result Value Ref Range Status   Specimen Description BLOOD LEFT HAND  Final   Special Requests   Final    BOTTLES DRAWN AEROBIC AND ANAEROBIC Blood Culture adequate volume   Culture   Final    NO GROWTH 5 DAYS Performed at [facility], [address] [place], [place] [address]    Report Status 08/22/2020 FINAL  Final  Culture, blood (Routine X 2) w Reflex to ID Panel  Status: None   Collection Time: 08/17/20  2:15 PM   Specimen: BLOOD LEFT ARM  Result Value Ref Range Status   Specimen Description BLOOD LEFT ARM  Final   Special Requests   Final    BOTTLES DRAWN AEROBIC ONLY Blood Culture results may not be optimal due to an inadequate volume of blood received in culture bottles   Culture   Final    NO GROWTH 5 DAYS Performed at [facility], [address] [place], [place] [address]    Report Status 08/22/2020 FINAL  Final  Culture, respiratory (non-expectorated)     Status: None   Collection Time: 08/17/20  6:14 PM   Specimen: Tracheal Aspirate; Respiratory  Result Value Ref Range Status   Specimen Description TRACHEAL ASPIRATE  Final   Special Requests  NONE  Final   Gram Stain   Final    RARE WBC PRESENT, PREDOMINANTLY PMN FEW GRAM NEGATIVE RODS Performed at [facility], [address], [place], [place] [address]    Culture ABUNDANT PSEUDOMONAS AERUGINOSA  Final   Report Status 08/20/2020 FINAL  Final   Organism ID, Bacteria PSEUDOMONAS AERUGINOSA  Final      Susceptibility   Pseudomonas aeruginosa - MIC*    CEFTAZIDIME 4 SENSITIVE Sensitive     CIPROFLOXACIN <=0.25 SENSITIVE Sensitive     GENTAMICIN <=1 SENSITIVE Sensitive     IMIPENEM 1 SENSITIVE Sensitive     PIP/TAZO 8 SENSITIVE Sensitive     CEFEPIME 2 SENSITIVE Sensitive     * ABUNDANT PSEUDOMONAS AERUGINOSA  Culture, Urine     Status: Abnormal   Collection Time: 08/27/20 11:41 AM   Specimen: Urine, Random  Result Value Ref Range Status   Specimen Description URINE, RANDOM  Final   Special Requests   Final    NONE Performed at [facility], [address], [place], [place] [address]    Culture 70,000 COLONIES/mL YEAST (A)  Final   Report Status 08/28/2020 FINAL  Final  Culture, respiratory (non-expectorated)     Status: None   Collection Time: 08/27/20 12:10 PM   Specimen: Tracheal Aspirate; Respiratory  Result Value Ref Range Status   Specimen Description TRACHEAL ASPIRATE  Final   Special Requests NONE  Final   Gram Stain   Final    MODERATE WBC PRESENT, PREDOMINANTLY PMN NO ORGANISMS SEEN Performed at [facility], [address], [place], [place] [address]    Culture   Final    RARE METHICILLIN RESISTANT STAPHYLOCOCCUS AUREUS RARE PSEUDOMONAS AERUGINOSA    Report Status 08/30/2020 FINAL  Final   Organism ID, Bacteria METHICILLIN RESISTANT STAPHYLOCOCCUS AUREUS  Final   Organism ID, Bacteria PSEUDOMONAS AERUGINOSA  Final      Susceptibility   Methicillin resistant staphylococcus aureus - MIC*    CIPROFLOXACIN >=8 RESISTANT Resistant     ERYTHROMYCIN >=8 RESISTANT Resistant     GENTAMICIN <=0.5 SENSITIVE Sensitive     OXACILLIN >=4  RESISTANT Resistant     TETRACYCLINE <=1 SENSITIVE Sensitive     VANCOMYCIN <=0.5 SENSITIVE Sensitive     TRIMETH/SULFA <=10 SENSITIVE Sensitive     CLINDAMYCIN <=0.25 SENSITIVE Sensitive     RIFAMPIN <=0.5 SENSITIVE Sensitive     Inducible Clindamycin NEGATIVE Sensitive     * RARE METHICILLIN RESISTANT STAPHYLOCOCCUS AUREUS   Pseudomonas aeruginosa - MIC*    CEFTAZIDIME 16 INTERMEDIATE Intermediate     CIPROFLOXACIN 1 SENSITIVE Sensitive     GENTAMICIN <=1 SENSITIVE  Sensitive     IMIPENEM 1 SENSITIVE Sensitive     * RARE PSEUDOMONAS AERUGINOSA  SARS CORONAVIRUS 2 (TAT 6-24 HRS) Nasopharyngeal Nasopharyngeal Swab     Status: Abnormal   Collection Time: 08/31/20  8:20 AM   Specimen: Nasopharyngeal Swab  Result Value Ref Range Status   SARS Coronavirus 2 POSITIVE (A) NEGATIVE Final    Comment: RESULT CALLED TO, READ BACK BY AND VERIFIED WITH: [facility] RN 13:55 08/31/20 ([email or web address]) (NOTE) SARS-CoV-2 target nucleic acids are DETECTED.  The SARS-CoV-2 RNA is generally detectable in upper and lower respiratory specimens during the acute phase of infection. Positive results are indicative of the presence of SARS-CoV-2 RNA. Clinical correlation with patient history and other diagnostic information is  necessary to determine patient infection status. Positive results do not rule out bacterial infection or co-infection with other viruses.  The expected result is Negative.  Fact Sheet for Patients: HairSlick.no  Fact Sheet for Healthcare Providers: quierodirigir.com  This test is not yet approved or cleared by the [place] FDA and  has been authorized for detection and/or diagnosis of SARS-CoV-2 by FDA under an Emergency Use Authorization (EUA). This EUA will remain  in effect (meaning this test can be used) for  the duration of the COVID-19 declaration under Section 564(b)(1) of the Act, 21 U.S.C. section 360bbb-3(b)(1), unless the  authorization is terminated or revoked sooner.   Performed at [facility], [address] [place], [place] [address]   Novel Coronavirus, NAA (Hosp order, Send-out to Ref Lab; TAT 18-24 hrs     Status: None   Collection Time: 08/31/20  2:00 PM   Specimen: Nasopharyngeal Swab; Respiratory  Result Value Ref Range Status   SARS-CoV-2, NAA NOT DETECTED NOT DETECTED Final    Comment: (NOTE) This nucleic acid amplification test was developed and its performance characteristics determined by [employer]. Nucleic acid amplification tests include RT-PCR and TMA. This test has not been FDA cleared or approved. This test has been authorized by FDA under an Emergency Use Authorization (EUA). This test is only authorized for the duration of time the declaration that circumstances exist justifying the authorization of the emergency use of in vitro diagnostic tests for detection of SARS-CoV-2 virus and/or diagnosis of COVID-19 infection under section 564(b)(1) of the Act, 21 U.S.C. 742VZD-6(L) (1), unless the authorization is terminated or revoked sooner. When diagnostic testing is negative, the possibility of a false negative result should be considered in the context of a patient's recent exposures and the presence of clinical signs and symptoms consistent with COVID-19. An individual without symptoms of COVID- 19 and who is not shedding SARS-CoV-2  virus would expect to have a negative (not detected) result in this assay. Performed At: [facility] [address] [place], [place] [id number] Jolene Schimke MD JJ:[id number]    Coronavirus Source NASOPHARYNGEAL  Final    Comment: Performed at [facility], [address], [place], [place] [address]  Culture, blood (routine x 2)     Status: None (Preliminary result)   Collection Time: 09/09/20 11:29 AM   Specimen: BLOOD RIGHT HAND  Result Value Ref Range Status   Specimen Description BLOOD RIGHT HAND  Final    Special Requests   Final    BOTTLES DRAWN AEROBIC ONLY Blood Culture results may not be optimal due to an inadequate volume of blood received in culture bottles   Culture   Final    NO GROWTH <12 HOURS Performed at [facility], [address] [place], [place] [address]    Report Status PENDING  Incomplete  Culture, blood (routine x 2)     Status: None (Preliminary result)   Collection Time: 09/09/20 11:34 AM   Specimen: BLOOD LEFT HAND  Result Value Ref Range Status   Specimen Description BLOOD LEFT HAND  Final   Special Requests   Final    BOTTLES DRAWN AEROBIC ONLY Blood Culture results may not be optimal due to an inadequate volume of blood received in culture bottles   Culture   Final    NO GROWTH <12 HOURS Performed at [facility], [address] [place], [place] [address]    Report Status PENDING  Incomplete    Coagulation Studies: No results for input(s): LABPROT, INR in the last 72 hours.  Urinalysis: No results for input(s): COLORURINE, LABSPEC, PHURINE, GLUCOSEU, HGBUR, BILIRUBINUR, KETONESUR, PROTEINUR, UROBILINOGEN, NITRITE, LEUKOCYTESUR in the last 72 hours.  Invalid input(s): APPERANCEUR    Imaging: DG CHEST PORT 1 VIEW  Result Date: 09/09/2020 CLINICAL DATA:  Chest tube placed for pneumothorax EXAM: PORTABLE CHEST 1 VIEW COMPARISON:  September 09, 2020 study obtained earlier in the day FINDINGS: Chest tube now present on the right inferiorly with resolution of pneumothorax. Apparent minimal pneumothorax remains in the right base. Extensive subcutaneous air noted on the right. Tracheostomy catheter tip is 4.5 cm above the carina. Central catheter tip is in the right atrium slightly beyond the cavoatrial junction. There is diffuse interstitial and patchy alveolar pulmonary edema. There is cardiomegaly with pulmonary venous hypertension. No adenopathy. There is air seen to the left of midline, likely representing a degree of pneumomediastinum. Air within  the pericardium is possible. IMPRESSION: Chest tube placed with only minimal residual pneumothorax in the right base remaining. Pneumothorax otherwise has resolved. Extensive subcutaneous air noted. Focus of air to the left of midline may represent a degree of pneumomediastinum. It is difficult to exclude air within the pericardium. Close clinical and imaging surveillance in this regard advised. Widespread pulmonary edema remains with cardiomegaly and pulmonary vascular congestion. Suspect underlying congestive heart failure. Tube and catheter positions as described. Electronically Signed   By: Bretta Bang III M.D.   On: 09/09/2020 10:30   DG CHEST PORT 1 VIEW  Result Date: 09/09/2020 CLINICAL DATA:  Acute respiratory distress EXAM: PORTABLE CHEST 1 VIEW COMPARISON:  08/28/2020 FINDINGS: New large right pneumothorax with partial atelectasis of the right lung superimposed on underlying right lung opacities. There is mild leftward mediastinal shift. Persistent and increased left lung opacities. Stable heart size. No pleural effusion. Tracheostomy device and right IJ central line are present. IMPRESSION: New large right pneumothorax with partial atelectasis of the right lung and suspected tension component. Increased left lung opacities. Critical Value/emergent results were called by telephone at the time of interpretation on 09/09/2020 at 9:05 am to provider CHUN LI , who verbally acknowledged these results. Electronically Signed   By: Guadlupe Spanish M.D.   On: 09/09/2020 09:07     Medications:       Assessment/ Plan:  74 y.o. female with a PMHx of recent acute respiratory failure secondary to COVID-19 pneumonia, hypertension, hyperlipidemia, diabetes mellitus type 2, depression, anxiety, obesity, atrial fibrillation, history of MRSA bacteremia and fungal pneumonia, recent acute kidney injury, who was admitted to [facility] on 08/23/2020 for ongoing treatment of acute respiratory failure.   1.   Acute kidney injury.    Patient developed instability earlier today with the development of tension  pneumothorax.  Chest tube subsequently placed.  We will hold off on dialysis today and allow her to stabilize over the weekend and plan for dialysis treatment again on Monday.  2.  Hypernatremia.  Sodium currently 137 and acceptable.  3.  Acute respiratory failure.  Developed tension pneumothorax earlier today.  This caused instability.  Therefore holding off on dialysis as above.  Continue mechanical ventilation for now.  Chest tube in place now.  4.  Generalized edema/volume overload.  Overall improved as compared to before she started dialysis.  Resume ultrafiltration on Monday.     LOS: 0 Jak Haggar 10/29/20212:44 PM

## 2020-09-09 NOTE — Progress Notes (Signed)
Pulmonary Critical Care Medicine [facility]   PULMONARY CRITICAL CARE SERVICE  PROGRESS NOTE  Date of Service: 09/09/2020  Aimee Peck  EKC:[id number]  DOB: [birth year]   DOA: 09/09/2020  Referring Physician: Carron Curie, MD  HPI: Aimee Peck is a 74 y.o. female seen for follow up of Acute on Chronic Respiratory Failure.  This morning patient had a pneumothorax developed.  The patient did code and did require CPR briefly.  The ER graciously came upstairs and performed a decompression.  Medications: Reviewed on Rounds  Physical Exam:  Vitals: Temperature is 98.2 pulse 86 respiratory rate 30 blood pressure is 122/65 saturations 93%  Ventilator Settings on assist control with an FiO2 of 60% PEEP 7 tidal volume 460  . General: Comfortable at this time . Eyes: Grossly normal lids, irises & conjunctiva . ENT: grossly tongue is normal . Neck: no obvious mass . Cardiovascular: S1 S2 normal no gallop . Respiratory: Scattered rhonchi bilaterally . Abdomen: soft . Skin: no rash seen on limited exam . Musculoskeletal: not rigid . Psychiatric:unable to assess . Neurologic: no seizure no involuntary movements         Lab Data:   Basic Metabolic Panel: Recent Labs  Lab 09/05/20 1122 09/07/20 0446 09/09/20 0505  NA 132* 135 137  K 3.5 3.4* 3.8  CL 95* 97* 99  CO2 27 25 27   GLUCOSE 102* 62* 122*  BUN 66* 41* 37*  CREATININE 1.69* 1.47* 1.45*  CALCIUM 8.5* 8.6* 8.4*  PHOS 3.8 4.0 3.7    ABG: Recent Labs  Lab 09/09/20 0824  PHART 7.360  PCO2ART 50.5*  PO2ART 49.6*  HCO3 27.6  O2SAT 85.3    Liver Function Tests: Recent Labs  Lab 09/05/20 1122 09/07/20 0446 09/09/20 0505  ALBUMIN 2.3* 2.4* 2.5*   No results for input(s): LIPASE, AMYLASE in the last 168 hours. No results for input(s): AMMONIA in the last 168 hours.  CBC: Recent Labs  Lab 09/05/20 1122 09/06/20 0506 09/07/20 0446 09/09/20 0505  WBC 9.7 7.6 8.1 6.8  HGB 7.0*  7.7* 8.7* 8.6*  HCT 22.8* 24.5* 28.1* 28.3*  MCV 95.4 95.7 96.6 96.9  PLT 137* 103* 133* 128*    Cardiac Enzymes: No results for input(s): CKTOTAL, CKMB, CKMBINDEX, TROPONINI in the last 168 hours.  BNP (last 3 results) No results for input(s): BNP in the last 8760 hours.  ProBNP (last 3 results) No results for input(s): PROBNP in the last 8760 hours.  Radiological Exams: IR GASTROSTOMY TUBE MOD SED  Result Date: 09/07/2020 INDICATION: 74 year old female with history of COVID-19 pneumonia in prolonged hospital course requiring tracheostomy and percutaneous enteric feeding. Recently accidentally removed indwelling gastrostomy tube. EXAM: PERC PLACEMENT GASTROSTOMY MEDICATIONS: Ancef 2 gm IV; Antibiotics were administered within 1 hour of the procedure. ANESTHESIA/SEDATION: Versed 1 mg IV; Fentanyl 25 mcg IV Moderate Sedation Time:  15 The patient was continuously monitored during the procedure by the interventional radiology nurse under my direct supervision. CONTRAST:  30mL OMNIPAQUE IOHEXOL 300 MG/ML SOLN - administered into the gastric lumen. FLUOROSCOPY TIME:  Fluoroscopy Time: 1 minutes 48 seconds (14 mGy). COMPLICATIONS: None immediate. PROCEDURE: Informed written consent was obtained from the patient after a thorough discussion of the procedural risks, benefits and alternatives. All questions were addressed. Maximal Sterile Barrier Technique was utilized including caps, mask, sterile gowns, sterile gloves, sterile drape, hand hygiene and skin antiseptic. A timeout was performed prior to the initiation of the procedure. The patient was placed on the procedure  table in the supine position. Pre-procedure abdominal film confirmed visualization of the transverse colon. The indwelling gastric decompression tube tip is located in the stomach radiographically. The patient was prepped and draped in usual sterile fashion. The stomach was insufflated with air via the indwelling nasogastric tube. Under  fluoroscopy, a puncture site was selected and local analgesia achieved with 1% lidocaine infiltrated subcutaneously. Under fluoroscopic guidance, a gastropexy needle was passed into the stomach and the T-bar suture was released. Entry into the stomach was confirmed with fluoroscopy, aspiration of air, and injection of contrast material. This was repeated with an additional gastropexy suture (for a total of 2 fasteners). At the center of these gastropexy sutures, a dermatotomy was performed. An 18 gauge needle was passed into the stomach at the site of this dermatotomy, and position within the gastric lumen again confirmed under fluoroscopy using aspiration of air and contrast injection. An Amplatz guidewire was passed through this needle and intraluminal placement within the stomach was confirmed by fluoroscopy. The needle was removed. Over the guidewire, the percutaneous tract was dilated using a 10 mm non-compliant balloon. The balloon was deflated, then pushed into the gastric lumen followed in concert by the 20 Fr gastrostomy tube. The retention balloon of the percutaneous gastrostomy tube was inflated with 20 mL of sterile water. The tube was withdrawn until the retention balloon was at the edge of the gastric lumen. The external bumper was brought to the abdominal wall. Contrast was injected through the gastrostomy tube, confirming intraluminal positioning. The patient tolerated the procedure well without any immediate post-procedural complications. IMPRESSION: Technically successful placement of 20 Fr gastrostomy tube. Marliss Coots, MD [facility] Radiology Electronically Signed   By: Marliss Coots MD   On: 09/07/2020 13:13   DG CHEST PORT 1 VIEW  Result Date: 09/09/2020 CLINICAL DATA:  Chest tube placed for pneumothorax EXAM: PORTABLE CHEST 1 VIEW COMPARISON:  September 09, 2020 study obtained earlier in the day FINDINGS: Chest tube now present on the right  inferiorly with resolution of pneumothorax. Apparent minimal pneumothorax remains in the right base. Extensive subcutaneous air noted on the right. Tracheostomy catheter tip is 4.5 cm above the carina. Central catheter tip is in the right atrium slightly beyond the cavoatrial junction. There is diffuse interstitial and patchy alveolar pulmonary edema. There is cardiomegaly with pulmonary venous hypertension. No adenopathy. There is air seen to the left of midline, likely representing a degree of pneumomediastinum. Air within the pericardium is possible. IMPRESSION: Chest tube placed with only minimal residual pneumothorax in the right base remaining. Pneumothorax otherwise has resolved. Extensive subcutaneous air noted. Focus of air to the left of midline may represent a degree of pneumomediastinum. It is difficult to exclude air within the pericardium. Close clinical and imaging surveillance in this regard advised. Widespread pulmonary edema remains with cardiomegaly and pulmonary vascular congestion. Suspect underlying congestive heart failure. Tube and catheter positions as described. Electronically Signed   By: Bretta Bang III M.D.   On: 09/09/2020 10:30   DG CHEST PORT 1 VIEW  Result Date: 09/09/2020 CLINICAL DATA:  Acute respiratory distress EXAM: PORTABLE CHEST 1 VIEW COMPARISON:  08/28/2020 FINDINGS: New large right pneumothorax with partial atelectasis of the right lung superimposed on underlying right lung opacities. There is mild leftward mediastinal shift. Persistent and increased left lung opacities. Stable heart size. No pleural effusion. Tracheostomy device and right IJ central line are present. IMPRESSION: New large right pneumothorax with partial atelectasis of the right  lung and suspected tension component. Increased left lung opacities. Critical Value/emergent results were called by telephone at the time of interpretation on 09/09/2020 at 9:05 am to provider CHUN LI , who verbally  acknowledged these results. Electronically Signed   By: Guadlupe Spanish M.D.   On: 09/09/2020 09:07    Assessment/Plan Active Problems:   Acute on chronic respiratory failure with hypoxia (HCC)   COVID-19 virus infection   Atrial fibrillation with RVR (HCC)   Acute renal failure due to tubular necrosis (HCC)   Metabolic encephalopathy   1. Acute on chronic respiratory failure hypoxia we will continue with on full support on the ventilator.  Patient is status post chest tube placement for pneumothorax.  Follow-up chest x-ray shows minimal pneumothorax 2. Pneumothorax now resolved after chest tube placement 3. COVID-19 virus infection in recovery we will continue with supportive care. 4. Atrial fibrillation with RVR rate is controlled 5. Acute renal failure following labs 6. Metabolic encephalopathy no change   I have personally seen and evaluated the patient, evaluated laboratory and imaging results, formulated the assessment and plan and placed orders. The Patient requires high complexity decision making with multiple systems involvement.  Rounds were done with the Respiratory Therapy Director and Staff therapists and discussed with nursing staff also.  Yevonne Pax, MD [facility] Pulmonary Critical Care Medicine Sleep Medicine

## 2020-09-09 NOTE — ED Provider Notes (Signed)
The ER received an urgent call from [facility] as patient was having worsening hypoxia and chest x-ray showed large right pneumothorax. Discussed with [facility] physician and they were originally going to send the patient down to the ER for assessment and chest tube as they did not have the equipment or the training to perform on the fifth floor. While waiting for the patient we received a phone call that the patient coded and they are doing CPR.  They requested we come up to the fifth floor to assist.  Patient has tension pneumothorax with hypotension and hypoxia.  The resident Dr. And myself brought chest tube kit and 14-gauge long needle upstairs with [place] to needle decompress followed by right side chest tube.  Chest x-ray reviewed from earlier and after chest tube and in appropriate position.  Patient saturations improved from the 50s to the upper 80s in the room.  Discussed the case upstairs with the primary provider and nursing staff.  I assisted resident with chest tube placement and supervised.   Mariea Clonts  Large right pneumothorax Tension pneumothorax   Elnora Morrison, MD 09/09/20 1038

## 2020-09-10 ENCOUNTER — Other Ambulatory Visit (HOSPITAL_COMMUNITY): Payer: Medicare Other

## 2020-09-10 DIAGNOSIS — N170 Acute kidney failure with tubular necrosis: Secondary | ICD-10-CM | POA: Diagnosis not present

## 2020-09-10 DIAGNOSIS — J9621 Acute and chronic respiratory failure with hypoxia: Secondary | ICD-10-CM | POA: Diagnosis not present

## 2020-09-10 DIAGNOSIS — U071 COVID-19: Secondary | ICD-10-CM | POA: Diagnosis not present

## 2020-09-10 DIAGNOSIS — I4891 Unspecified atrial fibrillation: Secondary | ICD-10-CM | POA: Diagnosis not present

## 2020-09-10 LAB — RENAL FUNCTION PANEL
Albumin: 2.1 g/dL — ABNORMAL LOW (ref 3.5–5.0)
Anion gap: 11 (ref 5–15)
BUN: 58 mg/dL — ABNORMAL HIGH (ref 8–23)
CO2: 26 mmol/L (ref 22–32)
Calcium: 8.2 mg/dL — ABNORMAL LOW (ref 8.9–10.3)
Chloride: 99 mmol/L (ref 98–111)
Creatinine, Ser: 1.78 mg/dL — ABNORMAL HIGH (ref 0.44–1.00)
GFR, Estimated: 29 mL/min — ABNORMAL LOW (ref >60)
Glucose, Bld: 72 mg/dL (ref 70–99)
Phosphorus: 4.9 mg/dL — ABNORMAL HIGH (ref 2.5–4.6)
Potassium: 3.8 mmol/L (ref 3.5–5.1)
Sodium: 136 mmol/L (ref 135–145)

## 2020-09-10 LAB — BLOOD CULTURE ID PANEL (REFLEXED) - BCID2

## 2020-09-10 LAB — URINALYSIS, ROUTINE W REFLEX MICROSCOPIC
Bacteria, UA: NONE SEEN
Bilirubin Urine: NEGATIVE
Glucose, UA: NEGATIVE mg/dL
Ketones, ur: NEGATIVE mg/dL
Nitrite: NEGATIVE
Protein, ur: 100 mg/dL — AB
RBC / HPF: >50 RBC/hpf — ABNORMAL HIGH (ref 0–5)
Specific Gravity, Urine: 1.011 (ref 1.005–1.030)
WBC, UA: >50 WBC/hpf — ABNORMAL HIGH (ref 0–5)
pH: 7 (ref 5.0–8.0)

## 2020-09-10 LAB — BLOOD GAS, ARTERIAL
Acid-Base Excess: 1 mmol/L (ref 0.0–2.0)
Bicarbonate: 26.4 mmol/L (ref 20.0–28.0)
FIO2: 75
O2 Saturation: 95.8 %
Patient temperature: 37
pCO2 arterial: 52.6 mmHg — ABNORMAL HIGH (ref 32.0–48.0)
pH, Arterial: 7.321 — ABNORMAL LOW (ref 7.350–7.450)
pO2, Arterial: 76.4 mmHg — ABNORMAL LOW (ref 83.0–108.0)

## 2020-09-10 LAB — MAGNESIUM: Magnesium: 1.9 mg/dL (ref 1.7–2.4)

## 2020-09-10 LAB — CBC
HCT: 27.7 % — ABNORMAL LOW (ref 36.0–46.0)
Hemoglobin: 8.4 g/dL — ABNORMAL LOW (ref 12.0–15.0)
MCH: 29.8 pg (ref 26.0–34.0)
MCHC: 30.3 g/dL (ref 30.0–36.0)
MCV: 98.2 fL (ref 80.0–100.0)
Platelets: 96 10*3/uL — ABNORMAL LOW (ref 150–400)
RBC: 2.82 MIL/uL — ABNORMAL LOW (ref 3.87–5.11)
RDW: 18.6 % — ABNORMAL HIGH (ref 11.5–15.5)
WBC: 9.4 10*3/uL (ref 4.0–10.5)
nRBC: 0 % (ref 0.0–0.2)

## 2020-09-10 NOTE — Progress Notes (Signed)
Pulmonary Critical Care Medicine [facility] [place]   PULMONARY CRITICAL CARE SERVICE  PROGRESS NOTE  Date of Service: 09/10/2020  Aimee Peck  ENI:[id number]  DOB: 1946/04/16   DOA: 08/20/2020  Referring Physician: Carron Curie, MD  HPI: Aimee Peck is a 74 y.o. female seen for follow up of Acute on Chronic Respiratory Failure.  Patient is comfortable right now without distress at this time.  Has been on assist control mode and currently is on 75% FiO2 saturations are running about 94%.  She had a chest tube placed yesterday  Medications: Reviewed on Rounds  Physical Exam:  Vitals: Temperature 98.7 pulse 120 respiratory rate 30 blood pressure is 139/65 saturations 94%  Ventilator Settings on assist control FiO2 75% tidal volume 464 PEEP 7  . General: Comfortable at this time . Eyes: Grossly normal lids, irises & conjunctiva . ENT: grossly tongue is normal . Neck: no obvious mass . Cardiovascular: S1 S2 normal no gallop . Respiratory: No rhonchi coarse breath sounds . Abdomen: soft . Skin: no rash seen on limited exam . Musculoskeletal: not rigid . Psychiatric:unable to assess . Neurologic: no seizure no involuntary movements         Lab Data:   Basic Metabolic Panel: Recent Labs  Lab 09/05/20 1122 09/07/20 0446 09/09/20 0505 09/10/20 0650  NA 132* 135 137 136  K 3.5 3.4* 3.8 3.8  CL 95* 97* 99 99  CO2 27 25 27 26   GLUCOSE 102* 62* 122* 72  BUN 66* 41* 37* 58*  CREATININE 1.69* 1.47* 1.45* 1.78*  CALCIUM 8.5* 8.6* 8.4* 8.2*  MG  --   --   --  1.9  PHOS 3.8 4.0 3.7 4.9*    ABG: Recent Labs  Lab 09/09/20 0824 09/10/20 0510  PHART 7.360 7.321*  PCO2ART 50.5* 52.6*  PO2ART 49.6* 76.4*  HCO3 27.6 26.4  O2SAT 85.3 95.8    Liver Function Tests: Recent Labs  Lab 09/05/20 1122 09/07/20 0446 09/09/20 0505 09/10/20 0650  ALBUMIN 2.3* 2.4* 2.5* 2.1*   No results for input(s): LIPASE, AMYLASE in the last 168 hours. No results  for input(s): AMMONIA in the last 168 hours.  CBC: Recent Labs  Lab 09/05/20 1122 09/06/20 0506 09/07/20 0446 09/09/20 0505 09/10/20 0650  WBC 9.7 7.6 8.1 6.8 9.4  HGB 7.0* 7.7* 8.7* 8.6* 8.4*  HCT 22.8* 24.5* 28.1* 28.3* 27.7*  MCV 95.4 95.7 96.6 96.9 98.2  PLT 137* 103* 133* 128* 96*    Cardiac Enzymes: No results for input(s): CKTOTAL, CKMB, CKMBINDEX, TROPONINI in the last 168 hours.  BNP (last 3 results) No results for input(s): BNP in the last 8760 hours.  ProBNP (last 3 results) No results for input(s): PROBNP in the last 8760 hours.  Radiological Exams: DG CHEST PORT 1 VIEW  Result Date: 09/10/2020 CLINICAL DATA:  Follow-up pneumothorax. EXAM: PORTABLE CHEST 1 VIEW COMPARISON:  09/09/2020 FINDINGS: Stable position of the right pigtail chest tube in the right lower chest. There is a small right apical pneumothorax. Right jugular dialysis catheter extends into the right atrium and stable. Patient has a tracheostomy tube. Diffuse bilateral parenchymal lung densities are unchanged. Decreased subcutaneous edema. Heart size remains enlarged. IMPRESSION: 1. Small right apical pneumothorax. Stable position of the right chest tube. Decreased subcutaneous gas. 2. Stable position of the dialysis catheter with the tip in the right atrium. 3. No significant change in the bilateral parenchymal lung densities. Electronically Signed   By: 09/11/2020 M.D.   On:  09/10/2020 08:47   DG CHEST PORT 1 VIEW  Result Date: 09/09/2020 CLINICAL DATA:  Chest tube placed for pneumothorax EXAM: PORTABLE CHEST 1 VIEW COMPARISON:  September 09, 2020 study obtained earlier in the day FINDINGS: Chest tube now present on the right inferiorly with resolution of pneumothorax. Apparent minimal pneumothorax remains in the right base. Extensive subcutaneous air noted on the right. Tracheostomy catheter tip is 4.5 cm above the carina. Central catheter tip is in the right atrium slightly beyond the cavoatrial  junction. There is diffuse interstitial and patchy alveolar pulmonary edema. There is cardiomegaly with pulmonary venous hypertension. No adenopathy. There is air seen to the left of midline, likely representing a degree of pneumomediastinum. Air within the pericardium is possible. IMPRESSION: Chest tube placed with only minimal residual pneumothorax in the right base remaining. Pneumothorax otherwise has resolved. Extensive subcutaneous air noted. Focus of air to the left of midline may represent a degree of pneumomediastinum. It is difficult to exclude air within the pericardium. Close clinical and imaging surveillance in this regard advised. Widespread pulmonary edema remains with cardiomegaly and pulmonary vascular congestion. Suspect underlying congestive heart failure. Tube and catheter positions as described. Electronically Signed   By: Bretta Bang III M.D.   On: 09/09/2020 10:30   DG CHEST PORT 1 VIEW  Result Date: 09/09/2020 CLINICAL DATA:  Acute respiratory distress EXAM: PORTABLE CHEST 1 VIEW COMPARISON:  08/28/2020 FINDINGS: New large right pneumothorax with partial atelectasis of the right lung superimposed on underlying right lung opacities. There is mild leftward mediastinal shift. Persistent and increased left lung opacities. Stable heart size. No pleural effusion. Tracheostomy device and right IJ central line are present. IMPRESSION: New large right pneumothorax with partial atelectasis of the right lung and suspected tension component. Increased left lung opacities. Critical Value/emergent results were called by telephone at the time of interpretation on 09/09/2020 at 9:05 am to provider Aimee Peck , who verbally acknowledged these results. Electronically Signed   By: Guadlupe Spanish M.D.   On: 09/09/2020 09:07   ECHOCARDIOGRAM COMPLETE  Result Date: 09/09/2020    ECHOCARDIOGRAM REPORT   Patient Name:   Aimee Peck [facility] Date of Exam: 09/09/2020 Medical Rec #:  [id number]           Height: Accession #:    [id number]         Weight: Date of Birth:  1946/08/20           BSA: Patient Age:    74 years           BP:           130/79 mmHg Patient Gender: F                  HR:           73 bpm. Exam Location:  Inpatient Procedure: 2D Echo Indications:     Cardiac Arrest  History:         Patient has no prior history of Echocardiogram examinations.                  Arrythmias:Atrial Fibrillation.  Sonographer:     Thurman Coyer RDCS (AE) Referring Phys:  [id number] Orpah Cobb Diagnosing Phys: Orpah Cobb MD IMPRESSIONS  1. Left ventricular ejection fraction, by estimation, is 55 to 60%. The left ventricle has normal function. The left ventricle demonstrates regional wall motion abnormalities (see scoring diagram/findings for description). Left ventricular diastolic parameters are consistent with Grade II diastolic dysfunction (pseudonormalization).  There is mild hypokinesis of the left ventricular, basal-mid inferior wall.  2. Right ventricular systolic function is mildly reduced. The right ventricular size is normal.  3. Left atrial size was moderately dilated.  4. Right atrial size was moderately dilated.  5. No evidence of pneumopericardium.  6. The mitral valve is normal in structure. Mild mitral valve regurgitation.  7. Tricuspid valve regurgitation is moderate.  8. The aortic valve is tricuspid. There is mild calcification of the aortic valve. There is mild thickening of the aortic valve. Aortic valve regurgitation is trivial. Mild aortic valve sclerosis is present, with no evidence of aortic valve stenosis.  9. There is mild (Grade II) atheroma plaque involving the aortic root and ascending aorta. FINDINGS  Left Ventricle: Left ventricular ejection fraction, by estimation, is 55 to 60%. The left ventricle has normal function. The left ventricle demonstrates regional wall motion abnormalities. Mild hypokinesis of the left ventricular, basal-mid inferior wall. The left ventricular internal cavity  size was normal in size. There is no left ventricular hypertrophy. Left ventricular diastolic parameters are consistent with Grade II diastolic dysfunction (pseudonormalization).  LV Wall Scoring: The inferior wall and posterior wall are hypokinetic. The entire anterior wall, antero-lateral wall, entire septum, and entire apex are normal. Right Ventricle: The right ventricular size is normal. No increase in right ventricular wall thickness. Right ventricular systolic function is mildly reduced. Left Atrium: Left atrial size was moderately dilated. Right Atrium: Right atrial size was moderately dilated. Pericardium: No evidence of pneumopericardium. There is no evidence of pericardial effusion. Mitral Valve: The mitral valve is normal in structure. Mild mitral valve regurgitation. Tricuspid Valve: The tricuspid valve is normal in structure. Tricuspid valve regurgitation is moderate. Aortic Valve: The aortic valve is tricuspid. There is mild calcification of the aortic valve. There is mild thickening of the aortic valve. There is mild to moderate aortic valve annular calcification. Aortic valve regurgitation is trivial. Mild aortic valve sclerosis is present, with no evidence of aortic valve stenosis. Pulmonic Valve: The pulmonic valve was normal in structure. Pulmonic valve regurgitation is trivial. Aorta: The aortic root is normal in size and structure. There is mild (Grade II) atheroma plaque involving the aortic root and ascending aorta. Venous: The inferior vena cava was not well visualized. IAS/Shunts: The interatrial septum was not assessed.  LEFT VENTRICLE PLAX 2D LVIDd:         3.20 cm  Diastology LVIDs:         2.10 cm  LV e' medial:    5.11 cm/s LV PW:         1.00 cm  LV E/e' medial:  18.2 LV IVS:        0.90 cm  LV e' lateral:   6.16 cm/s LVOT diam:     2.10 cm  LV E/e' lateral: 15.1 LV SV:         71 LVOT Area:     3.46 cm  RIGHT VENTRICLE RV S prime:     9.89 cm/s TAPSE (M-mode): 1.7 cm LEFT ATRIUM              RIGHT ATRIUM LA diam:        4.10 cm RA Area:     17.50 cm LA Vol (A2C):   58.0 ml RA Volume:   42.60 ml LA Vol (A4C):   55.9 ml LA Biplane Vol: 58.3 ml  AORTIC VALVE LVOT Vmax:   117.00 cm/s LVOT Vmean:  74.300 cm/s LVOT VTI:  0.206 m  AORTA Ao Root diam: 2.70 cm MITRAL VALVE               TRICUSPID VALVE MV Area (PHT): 3.08 cm    TR Peak grad:   43.0 mmHg MV Decel Time: 246 msec    TR Vmax:        328.00 cm/s MV E velocity: 93.10 cm/s MV A velocity: 76.20 cm/s  SHUNTS MV E/A ratio:  1.22        Systemic VTI:  0.21 m                            Systemic Diam: 2.10 cm Orpah CobbAjay Kadakia MD Electronically signed by Orpah CobbAjay Kadakia MD Signature Date/Time: 09/09/2020/4:15:16 PM    Final     Assessment/Plan Active Problems:   Acute on chronic respiratory failure with hypoxia (HCC)   COVID-19 virus infection   Atrial fibrillation with RVR (HCC)   Acute renal failure due to tubular necrosis (HCC)   Metabolic encephalopathy   1. Acute on chronic respiratory failure with hypoxia patient is currently on the ventilator and full support after the pneumothorax FiO2 has been increased right now significantly tachycardic also.  Spoke with respiratory therapy about trying to wean the FiO2 down 2. COVID-19 virus infection in recovery we will continue with supportive care 3. Pneumothorax status post chest tube placement. 4. Atrial fibrillation RVR rate is controlled 5. Acute renal failure tubular necrosis following labs 6. Metabolic encephalopathy no change.   I have personally seen and evaluated the patient, evaluated laboratory and imaging results, formulated the assessment and plan and placed orders. The Patient requires high complexity decision making with multiple systems involvement.  Rounds were done with the Respiratory Therapy Director and Staff therapists and discussed with nursing staff also.  Yevonne PaxSaadat A Kingsly Kloepfer, MD [facility] Pulmonary Critical Care Medicine Sleep Medicine

## 2020-09-11 ENCOUNTER — Other Ambulatory Visit (HOSPITAL_COMMUNITY): Payer: Medicare Other

## 2020-09-11 DIAGNOSIS — U071 COVID-19: Secondary | ICD-10-CM | POA: Diagnosis not present

## 2020-09-11 DIAGNOSIS — J9621 Acute and chronic respiratory failure with hypoxia: Secondary | ICD-10-CM | POA: Diagnosis not present

## 2020-09-11 DIAGNOSIS — I4891 Unspecified atrial fibrillation: Secondary | ICD-10-CM | POA: Diagnosis not present

## 2020-09-11 DIAGNOSIS — N170 Acute kidney failure with tubular necrosis: Secondary | ICD-10-CM | POA: Diagnosis not present

## 2020-09-11 LAB — CBC
HCT: 26 % — ABNORMAL LOW (ref 36.0–46.0)
Hemoglobin: 7.9 g/dL — ABNORMAL LOW (ref 12.0–15.0)
MCH: 29.6 pg (ref 26.0–34.0)
MCHC: 30.4 g/dL (ref 30.0–36.0)
MCV: 97.4 fL (ref 80.0–100.0)
Platelets: 128 10*3/uL — ABNORMAL LOW (ref 150–400)
RBC: 2.67 MIL/uL — ABNORMAL LOW (ref 3.87–5.11)
RDW: 18.6 % — ABNORMAL HIGH (ref 11.5–15.5)
WBC: 9.3 10*3/uL (ref 4.0–10.5)
nRBC: 0.3 % — ABNORMAL HIGH (ref 0.0–0.2)

## 2020-09-11 LAB — RENAL FUNCTION PANEL
Albumin: 2 g/dL — ABNORMAL LOW (ref 3.5–5.0)
Anion gap: 13 (ref 5–15)
BUN: 78 mg/dL — ABNORMAL HIGH (ref 8–23)
CO2: 25 mmol/L (ref 22–32)
Calcium: 8.5 mg/dL — ABNORMAL LOW (ref 8.9–10.3)
Chloride: 95 mmol/L — ABNORMAL LOW (ref 98–111)
Creatinine, Ser: 2.15 mg/dL — ABNORMAL HIGH (ref 0.44–1.00)
GFR, Estimated: 23 mL/min — ABNORMAL LOW (ref >60)
Glucose, Bld: 140 mg/dL — ABNORMAL HIGH (ref 70–99)
Phosphorus: 5.6 mg/dL — ABNORMAL HIGH (ref 2.5–4.6)
Potassium: 3.9 mmol/L (ref 3.5–5.1)
Sodium: 133 mmol/L — ABNORMAL LOW (ref 135–145)

## 2020-09-11 LAB — BLOOD GAS, ARTERIAL
Acid-base deficit: 1.1 mmol/L (ref 0.0–2.0)
Bicarbonate: 24.5 mmol/L (ref 20.0–28.0)
FIO2: 55
O2 Saturation: 89.7 %
Patient temperature: 37
pCO2 arterial: 51.5 mmHg — ABNORMAL HIGH (ref 32.0–48.0)
pH, Arterial: 7.299 — ABNORMAL LOW (ref 7.350–7.450)
pO2, Arterial: 58.6 mmHg — ABNORMAL LOW (ref 83.0–108.0)

## 2020-09-11 LAB — MAGNESIUM: Magnesium: 1.9 mg/dL (ref 1.7–2.4)

## 2020-09-11 LAB — URINE CULTURE

## 2020-09-11 NOTE — Consult Note (Signed)
Ref: Shayne Alken, MD   Subjective:  F/U acute on chronic respiratory failure and right sided apical pneumothorax. Echocardiogram negative for pneumopericardium, preserved LV systolic function. EF 55-60 %.  Objective:  Vital Signs in the last 24 hours:  P:102 BP: 107/63 R: 31 O2 sat 92 % FiO2 55 %. Full ventilator support.  Physical Exam: BP Readings from Last 1 Encounters:  09/07/20 130/79     Wt Readings from Last 1 Encounters:  No data found for Wt    Weight change:  There is no height or weight on file to calculate BMI. HEENT: Aimee Peck/AT, Eyes-Blue, Conjunctiva-Pale, Sclera-Non-icteric Neck: No JVD, No bruit, Trachea midline. Lungs:  Scattered rhonchi, Bilateral. Cardiac:  Irregular rhythm, normal S1 and S2, no S3. II/VI systolic murmur. Abdomen:  Soft, BS present. Extremities:  1 + edema present. No cyanosis. No clubbing. CNS: AxOx0, Cranial nerves grossly intact.  Skin: Warm and dry.   Intake/Output from previous day: No intake/output data recorded.    Lab Results: BMET    Component Value Date/Time   NA 136 09/10/2020 0650   NA 137 09/09/2020 0505   NA 135 09/07/2020 0446   K 3.8 09/10/2020 0650   K 3.8 09/09/2020 0505   K 3.4 (L) 09/07/2020 0446   CL 99 09/10/2020 0650   CL 99 09/09/2020 0505   CL 97 (L) 09/07/2020 0446   CO2 26 09/10/2020 0650   CO2 27 09/09/2020 0505   CO2 25 09/07/2020 0446   GLUCOSE 72 09/10/2020 0650   GLUCOSE 122 (H) 09/09/2020 0505   GLUCOSE 62 (L) 09/07/2020 0446   BUN 58 (H) 09/10/2020 0650   BUN 37 (H) 09/09/2020 0505   BUN 41 (H) 09/07/2020 0446   CREATININE 1.78 (H) 09/10/2020 0650   CREATININE 1.45 (H) 09/09/2020 0505   CREATININE 1.47 (H) 09/07/2020 0446   CALCIUM 8.2 (L) 09/10/2020 0650   CALCIUM 8.4 (L) 09/09/2020 0505   CALCIUM 8.6 (L) 09/07/2020 0446   GFRNONAA 29 (L) 09/10/2020 0650   GFRNONAA 38 (L) 09/09/2020 0505   GFRNONAA 37 (L) 09/07/2020 0446   CBC    Component Value Date/Time   WBC 9.4  09/10/2020 0650   RBC 2.82 (L) 09/10/2020 0650   HGB 8.4 (L) 09/10/2020 0650   HCT 27.7 (L) 09/10/2020 0650   PLT 96 (L) 09/10/2020 0650   MCV 98.2 09/10/2020 0650   MCH 29.8 09/10/2020 0650   MCHC 30.3 09/10/2020 0650   RDW 18.6 (H) 09/10/2020 0650   LYMPHSABS 1.6 08/17/2020 1018   MONOABS 0.5 08/17/2020 1018   EOSABS 0.1 08/17/2020 1018   BASOSABS 0.1 08/17/2020 1018   HEPATIC Function Panel Recent Labs    08/17/20 1018  PROT 5.9*   HEMOGLOBIN A1C No components found for: HGA1C,  MPG CARDIAC ENZYMES No results found for: CKTOTAL, CKMB, CKMBINDEX, TROPONINI BNP No results for input(s): PROBNP in the last 8760 hours. TSH No results for input(s): TSH in the last 8760 hours. CHOLESTEROL No results for input(s): CHOL in the last 8760 hours.  Scheduled Meds: Continuous Infusions: PRN Meds:.  Assessment/Plan: Acute on chronic respiratory failure with hypoxia COVID-19 infection S/P Acute right sided pneumothorax S/P right sided chest tube for above Atrial fibrillation, chronic Metabolic encephalopathy Acute renal failure  Continue current treatment.   LOS: 0 days   Time spent including chart review, lab review, examination, discussion with patient : 30 min   Orpah Cobb  MD  09/11/2020, 12:46 PM

## 2020-09-11 NOTE — Progress Notes (Signed)
Pulmonary Critical Care Medicine [facility] [place]   PULMONARY CRITICAL CARE SERVICE  PROGRESS NOTE  Date of Service: 09/11/2020  Aimee Peck  RWE:[id number]  DOB: 02-22-1946   DOA: 08/30/2020  Referring Physician: Carron Curie, MD  HPI: CHE BELOW is a 74 y.o. female seen for follow up of Acute on Chronic Respiratory Failure.  Patient remains on the ventilator and full support.  Her oxygen requirements are down to 55%  Medications: Reviewed on Rounds  Physical Exam:  Vitals: Temperature is 97.2 pulse 87 respiratory rate is 33 blood pressure is 115/58 saturations 97%  Ventilator Settings on assist control FiO2 55% tidal volume 477 PEEP 7  . General: Comfortable at this time . Eyes: Grossly normal lids, irises & conjunctiva . ENT: grossly tongue is normal . Neck: no obvious mass . Cardiovascular: S1 S2 normal no gallop . Respiratory: No rhonchi very coarse breath sounds . Abdomen: soft . Skin: no rash seen on limited exam . Musculoskeletal: not rigid . Psychiatric:unable to assess . Neurologic: no seizure no involuntary movements         Lab Data:   Basic Metabolic Panel: Recent Labs  Lab 09/05/20 1122 09/07/20 0446 09/09/20 0505 09/10/20 0650  NA 132* 135 137 136  K 3.5 3.4* 3.8 3.8  CL 95* 97* 99 99  CO2 27 25 27 26   GLUCOSE 102* 62* 122* 72  BUN 66* 41* 37* 58*  CREATININE 1.69* 1.47* 1.45* 1.78*  CALCIUM 8.5* 8.6* 8.4* 8.2*  MG  --   --   --  1.9  PHOS 3.8 4.0 3.7 4.9*    ABG: Recent Labs  Lab 09/09/20 0824 09/10/20 0510  PHART 7.360 7.321*  PCO2ART 50.5* 52.6*  PO2ART 49.6* 76.4*  HCO3 27.6 26.4  O2SAT 85.3 95.8    Liver Function Tests: Recent Labs  Lab 09/05/20 1122 09/07/20 0446 09/09/20 0505 09/10/20 0650  ALBUMIN 2.3* 2.4* 2.5* 2.1*   No results for input(s): LIPASE, AMYLASE in the last 168 hours. No results for input(s): AMMONIA in the last 168 hours.  CBC: Recent Labs  Lab 09/05/20 1122  09/06/20 0506 09/07/20 0446 09/09/20 0505 09/10/20 0650  WBC 9.7 7.6 8.1 6.8 9.4  HGB 7.0* 7.7* 8.7* 8.6* 8.4*  HCT 22.8* 24.5* 28.1* 28.3* 27.7*  MCV 95.4 95.7 96.6 96.9 98.2  PLT 137* 103* 133* 128* 96*    Cardiac Enzymes: No results for input(s): CKTOTAL, CKMB, CKMBINDEX, TROPONINI in the last 168 hours.  BNP (last 3 results) No results for input(s): BNP in the last 8760 hours.  ProBNP (last 3 results) No results for input(s): PROBNP in the last 8760 hours.  Radiological Exams: DG Abd 1 View  Result Date: 09/11/2020 CLINICAL DATA:  PICC line EXAM: ABDOMEN - 1 VIEW COMPARISON:  August 28, 2020 FINDINGS: Peg tube projected over the gastric shadow. No PICC line seen. No bowel obstruction. No other acute abnormalities. IMPRESSION: PEG tube projects over the gastric shadow. No PICC line identified. No acute abnormalities. Electronically Signed   By: August 30, 2020 III M.D   On: 09/11/2020 12:43   DG CHEST PORT 1 VIEW  Result Date: 09/11/2020 CLINICAL DATA:  Evaluate PICC line EXAM: PORTABLE CHEST 1 VIEW COMPARISON:  September 11, 2020 at 11:53 a.m. FINDINGS: The right PICC line is been adjusted. The distal tip now terminates near the caval atrial junction. The right central line and tracheostomy tube are stable. The right chest tube is stable. Stable right small pneumothorax. Stable bilateral pulmonary  infiltrates. No other changes. IMPRESSION: 1. The right PICC line has been adjusted and now terminates near the caval atrial junction. Otherwise, stable support apparatus. 2. Stable small right pneumothorax. 3. Stable bilateral pulmonary infiltrates. Electronically Signed   By: Gerome Sam III M.D   On: 09/11/2020 12:44   DG CHEST PORT 1 VIEW  Result Date: 09/11/2020 CLINICAL DATA:  PICC line placement. EXAM: PORTABLE CHEST 1 VIEW COMPARISON:  September 11, 2020 FINDINGS: A right chest tube remains in place. A small right apical pneumothorax remains, unchanged. Stable tracheostomy  tube. Stable right central line terminating in the right side of the atrium. The new right PICC line reaches the SVC but the distal tip curves back on itself. Stable cardiomediastinal silhouette. Diffuse bilateral pulmonary infiltrates are stable to worsened in the interval. IMPRESSION: 1. The new right PICC line distal tip flipped back on itself. Recommend repositioning. 2. Other support apparatus as above. 3. Persistent small right-sided pneumothorax, unchanged. 4. Diffuse bilateral pulmonary infiltrates are stable to mildly worsened in the interval. Electronically Signed   By: Gerome Sam III M.D   On: 09/11/2020 12:42   DG CHEST PORT 1 VIEW  Result Date: 09/11/2020 CLINICAL DATA:  Follow-up pneumothorax. EXAM: PORTABLE CHEST 1 VIEW COMPARISON:  09/10/2020 and earlier exams. FINDINGS: Tiny right apical pneumothorax is without change from the previous day's exam. Stable inferior right hemithorax chest tube. Bilateral airspace lung opacities are also unchanged. No new lung abnormalities. Stable tracheostomy tube and right internal jugular dual-lumen central venous catheter. IMPRESSION: 1. No change from the previous day's exam. 2. Persistent small right apical pneumothorax. Stable right inferior hemithorax chest tube. 3. No change in the bilateral airspace lung opacities. Electronically Signed   By: Amie Portland M.D.   On: 09/11/2020 07:49   DG CHEST PORT 1 VIEW  Result Date: 09/10/2020 CLINICAL DATA:  Follow-up pneumothorax. EXAM: PORTABLE CHEST 1 VIEW COMPARISON:  09/09/2020 FINDINGS: Stable position of the right pigtail chest tube in the right lower chest. There is a small right apical pneumothorax. Right jugular dialysis catheter extends into the right atrium and stable. Patient has a tracheostomy tube. Diffuse bilateral parenchymal lung densities are unchanged. Decreased subcutaneous edema. Heart size remains enlarged. IMPRESSION: 1. Small right apical pneumothorax. Stable position of the right  chest tube. Decreased subcutaneous gas. 2. Stable position of the dialysis catheter with the tip in the right atrium. 3. No significant change in the bilateral parenchymal lung densities. Electronically Signed   By: Richarda Overlie M.D.   On: 09/10/2020 08:47    Assessment/Plan Active Problems:   Acute on chronic respiratory failure with hypoxia (HCC)   COVID-19 virus infection   Atrial fibrillation with RVR (HCC)   Acute renal failure due to tubular necrosis (HCC)   Metabolic encephalopathy   1. Acute on chronic respiratory failure hypoxia we will continue with full support on the ventilator.  Patient's mechanics have not been adequate to try to wean.  She is status post chest tube placement for pneumothorax 2. COVID-19 virus infection resolved we will continue with supportive care 3. Atrial fibrillation with RVR rate is controlled right now 4. Acute renal failure tubular necrosis we will continue with supportive care. 5. Metabolic encephalopathy no change   I have personally seen and evaluated the patient, evaluated laboratory and imaging results, formulated the assessment and plan and placed orders. The Patient requires high complexity decision making with multiple systems involvement.  Rounds were done with the Respiratory Therapy Director and Staff therapists  and discussed with nursing staff also.  Allyne Gee, MD [facility] Pulmonary Critical Care Medicine Sleep Medicine

## 2020-09-12 ENCOUNTER — Other Ambulatory Visit (HOSPITAL_COMMUNITY): Payer: Medicare Other

## 2020-09-12 DIAGNOSIS — J9621 Acute and chronic respiratory failure with hypoxia: Secondary | ICD-10-CM | POA: Diagnosis not present

## 2020-09-12 DIAGNOSIS — N170 Acute kidney failure with tubular necrosis: Secondary | ICD-10-CM | POA: Diagnosis not present

## 2020-09-12 DIAGNOSIS — U071 COVID-19: Secondary | ICD-10-CM | POA: Diagnosis not present

## 2020-09-12 DIAGNOSIS — I4891 Unspecified atrial fibrillation: Secondary | ICD-10-CM | POA: Diagnosis not present

## 2020-09-12 HISTORY — PX: IR CATHETER TUBE CHANGE: IMG717

## 2020-09-12 LAB — CBC
HCT: 25 % — ABNORMAL LOW (ref 36.0–46.0)
Hemoglobin: 7.7 g/dL — ABNORMAL LOW (ref 12.0–15.0)
MCH: 30.3 pg (ref 26.0–34.0)
MCHC: 30.8 g/dL (ref 30.0–36.0)
MCV: 98.4 fL (ref 80.0–100.0)
Platelets: 130 10*3/uL — ABNORMAL LOW (ref 150–400)
RBC: 2.54 MIL/uL — ABNORMAL LOW (ref 3.87–5.11)
RDW: 18.6 % — ABNORMAL HIGH (ref 11.5–15.5)
WBC: 7.7 10*3/uL (ref 4.0–10.5)
nRBC: 0.9 % — ABNORMAL HIGH (ref 0.0–0.2)

## 2020-09-12 LAB — CULTURE, RESPIRATORY W GRAM STAIN

## 2020-09-12 LAB — RENAL FUNCTION PANEL
Albumin: 1.8 g/dL — ABNORMAL LOW (ref 3.5–5.0)
Anion gap: 13 (ref 5–15)
BUN: 89 mg/dL — ABNORMAL HIGH (ref 8–23)
CO2: 23 mmol/L (ref 22–32)
Calcium: 8.4 mg/dL — ABNORMAL LOW (ref 8.9–10.3)
Chloride: 97 mmol/L — ABNORMAL LOW (ref 98–111)
Creatinine, Ser: 2.36 mg/dL — ABNORMAL HIGH (ref 0.44–1.00)
GFR, Estimated: 21 mL/min — ABNORMAL LOW (ref >60)
Glucose, Bld: 140 mg/dL — ABNORMAL HIGH (ref 70–99)
Phosphorus: 6.1 mg/dL — ABNORMAL HIGH (ref 2.5–4.6)
Potassium: 4 mmol/L (ref 3.5–5.1)
Sodium: 133 mmol/L — ABNORMAL LOW (ref 135–145)

## 2020-09-12 LAB — CULTURE, BLOOD (ROUTINE X 2)

## 2020-09-12 LAB — VANCOMYCIN, TROUGH: Vancomycin Tr: 10 ug/mL — ABNORMAL LOW (ref 15–20)

## 2020-09-12 MED ORDER — LIDOCAINE HCL 1 % IJ SOLN
INTRAMUSCULAR | Status: AC
  Filled 2020-09-12: qty 20

## 2020-09-12 MED ORDER — LIDOCAINE HCL 1 % IJ SOLN
INTRAMUSCULAR | Status: DC | PRN
  Administered 2020-09-12: 20 mL via INTRADERMAL

## 2020-09-12 NOTE — Procedures (Signed)
Pre procedural Dx: Malpositioned chest tube Post procedural Dx: Same  Technically successful fluoro guided exchange and repositioning of 14 Fr right sided chest tube.  EBL: None Complications: None immediate  Katherina Right, MD Pager #: [phone]

## 2020-09-12 NOTE — Progress Notes (Signed)
IR consulted by Camelia Eng, NP for right chest tube evaluation.  History of progressive right PTX s/p right chest tube placement by EDP bedside 09/09/2020. CXR this AM revealing increasing right PTX with questionable right chest tube manipulation (?pulled back closer to lateral chest wall)- request for right chest tube evaluation.  CXR reviewed by Dr. Grace Isaac who states right chest tube does appear to be pulled back, however is still within the pleural space (in addition, no worsening SQ air on CXR to suggest side holes are outside pleural space).  On PE, patient awake and alert laying in bed, tracheostomy in place, nonverbal at baseline, eyes do not follow around room. Right chest tube site stable and appears in proper position (no side hole exposure). Pleure-vac with approximately 950 cc serosanguinous fluid (50 cc increase from 0600 this AM per documentation on Pleure-vac). Tube hooked to suction with (+) air leak. Saturating 97-98% on tracheotomy.  IR recommendations: 1- Tube appears to be functioning well. No plans for IR interventions at this time- will delete order. 2- Secure right chest tube to skin (stat-lock given to [facility] for this). 3- Continue to monitor with daily CXR.  Pryia, NP aware of above. Please call IR with questions/concerns.   Waylan Boga Axl Rodino, PA-C 09/12/2020, 10:08 AM

## 2020-09-12 NOTE — Consult Note (Signed)
Ref: Shayne Alken, MD   Subjective:  F/U atrial flutter. F/U right apical pneumothorax.  Objective:  Vital Signs in the last 24 hours:  P: 81 R: 32 BP: 107/67 O2 sat 93 % on 70 % FiO2  Physical Exam: BP Readings from Last 1 Encounters:  09/07/20 130/79     Wt Readings from Last 1 Encounters:  No data found for Wt    Weight change:  There is no height or weight on file to calculate BMI. HEENT: [place]/AT, Eyes-Blue, Conjunctiva-Pale, Sclera-Non-icteric Neck: No JVD, No bruit, Trachea midline. Lungs:  Clearing, Bilateral. Cardiac:  Irregular rhythm, normal S1 and S2, no S3. II/VI systolic murmur. Abdomen:  Soft, BS present. Extremities:  1 + edema present. No cyanosis. No clubbing. CNS: AxOx0, Cranial nerves grossly intact.  Skin: Warm and dry.   Intake/Output from previous day: No intake/output data recorded.    Lab Results: BMET    Component Value Date/Time   NA 133 (L) 09/12/2020 0617   NA 133 (L) 09/11/2020 1317   NA 136 09/10/2020 0650   K 4.0 09/12/2020 0617   K 3.9 09/11/2020 1317   K 3.8 09/10/2020 0650   CL 97 (L) 09/12/2020 0617   CL 95 (L) 09/11/2020 1317   CL 99 09/10/2020 0650   CO2 23 09/12/2020 0617   CO2 25 09/11/2020 1317   CO2 26 09/10/2020 0650   GLUCOSE 140 (H) 09/12/2020 0617   GLUCOSE 140 (H) 09/11/2020 1317   GLUCOSE 72 09/10/2020 0650   BUN 89 (H) 09/12/2020 0617   BUN 78 (H) 09/11/2020 1317   BUN 58 (H) 09/10/2020 0650   CREATININE 2.36 (H) 09/12/2020 0617   CREATININE 2.15 (H) 09/11/2020 1317   CREATININE 1.78 (H) 09/10/2020 0650   CALCIUM 8.4 (L) 09/12/2020 0617   CALCIUM 8.5 (L) 09/11/2020 1317   CALCIUM 8.2 (L) 09/10/2020 0650   GFRNONAA 21 (L) 09/12/2020 0617   GFRNONAA 23 (L) 09/11/2020 1317   GFRNONAA 29 (L) 09/10/2020 0650   CBC    Component Value Date/Time   WBC 7.7 09/12/2020 0617   RBC 2.54 (L) 09/12/2020 0617   HGB 7.7 (L) 09/12/2020 0617   HCT 25.0 (L) 09/12/2020 0617   PLT 130 (L) 09/12/2020 0617    MCV 98.4 09/12/2020 0617   MCH 30.3 09/12/2020 0617   MCHC 30.8 09/12/2020 0617   RDW 18.6 (H) 09/12/2020 0617   LYMPHSABS 1.6 08/17/2020 1018   MONOABS 0.5 08/17/2020 1018   EOSABS 0.1 08/17/2020 1018   BASOSABS 0.1 08/17/2020 1018   HEPATIC Function Panel Recent Labs    08/17/20 1018  PROT 5.9*   HEMOGLOBIN A1C No components found for: HGA1C,  MPG CARDIAC ENZYMES No results found for: CKTOTAL, CKMB, CKMBINDEX, TROPONINI BNP No results for input(s): PROBNP in the last 8760 hours. TSH No results for input(s): TSH in the last 8760 hours. CHOLESTEROL No results for input(s): CHOL in the last 8760 hours.  Scheduled Meds: . lidocaine       Continuous Infusions: PRN Meds:.lidocaine  Assessment/Plan: Acute on chronic respiratory failure with hypoxia COVID-19 infection S/P acute right sided pneumothorax S/P right sided chest tube with drainage Chronic atrial fibrillation Metabolic encephalopathy Acute renal failure  Continue medical treatment per critical care/Pulmonary.   LOS: 0 days   Time spent including chart review, lab review, examination, discussion with patient/Nurse : 30 min   Orpah Cobb  MD  09/12/2020, 8:07 PM

## 2020-09-12 NOTE — Progress Notes (Signed)
[facility]  ROUNDING NOTE   Subjective:  Patient remains critically ill. FiO2 70% at the moment. Still on the ventilator.  Objective:  Vital signs in last 24 hours:  Temperature 95.1 pulse 93 respirations 29 blood pressure 103/55  Physical Exam: General:  Critically ill-appearing  Head:  Normocephalic, atraumatic.  Oral mucosa moist  Eyes:  Anicteric  Neck:  Tracheostomy in place  Lungs:   Coarse breath sounds bilateral, vent assisted, right-sided chest tube in place  Heart:  S1S2 no rubs  Abdomen:   Soft, nontender, bowel sounds present  Extremities:  2+ upper and lower extremity edema  Neurologic:  On the ventilator, not following commands  Skin:  No acute rashes  Access:  Right IJ temporary dialysis catheter    Basic Metabolic Panel: Recent Labs  Lab 09/07/20 0446 09/07/20 0446 09/09/20 0505 09/09/20 0505 09/10/20 0650 09/11/20 1317 09/12/20 0617  NA 135  --  137  --  136 133* 133*  K 3.4*  --  3.8  --  3.8 3.9 4.0  CL 97*  --  99  --  99 95* 97*  CO2 25  --  27  --  GLUCOSE 62*  --  122*  --  72 140* 140*  BUN 41*  --  37*  --  58* 78* 89*  CREATININE 1.47*  --  1.45*  --  1.78* 2.15* 2.36*  CALCIUM 8.6*   < > 8.4*   < > 8.2* 8.5* 8.4*  MG  --   --   --   --  1.9 1.9  --   PHOS 4.0  --  3.7  --  4.9* 5.6* 6.1*   < > = values in this interval not displayed.    Liver Function Tests: Recent Labs  Lab 09/07/20 0446 09/09/20 0505 09/10/20 0650 09/11/20 1317 09/12/20 0617  ALBUMIN 2.4* 2.5* 2.1* 2.0* 1.8*   No results for input(s): LIPASE, AMYLASE in the last 168 hours. No results for input(s): AMMONIA in the last 168 hours.  CBC: Recent Labs  Lab 09/07/20 0446 09/09/20 0505 09/10/20 0650 09/11/20 1317 09/12/20 0617  WBC 8.1 6.8 9.4 9.3 7.7  HGB 8.7* 8.6* 8.4* 7.9* 7.7*  HCT 28.1* 28.3* 27.7* 26.0* 25.0*  MCV 96.6 96.9 98.2 97.4 98.4  PLT 133* 128* 96* 128* 130*    Cardiac Enzymes: No results for input(s): CKTOTAL,  CKMB, CKMBINDEX, TROPONINI in the last 168 hours.  BNP: Invalid input(s): POCBNP  CBG: No results for input(s): GLUCAP in the last 168 hours.  Microbiology: Results for orders placed or performed during the hospital encounter of 09/10/2020  Urine Culture     Status: Abnormal   Collection Time: 08/17/20  1:32 PM   Specimen: Urine, Random  Result Value Ref Range Status   Specimen Description URINE, RANDOM  Final   Special Requests   Final    NONE Performed at [facility], [address] [place], [place] [address]    Culture >=100,000 COLONIES/mL PSEUDOMONAS AERUGINOSA (A)  Final   Report Status 08/19/2020 FINAL  Final   Organism ID, Bacteria PSEUDOMONAS AERUGINOSA (A)  Final      Susceptibility   Pseudomonas aeruginosa - MIC*    CEFTAZIDIME 16 INTERMEDIATE Intermediate     CIPROFLOXACIN <=0.25 SENSITIVE Sensitive     GENTAMICIN <=1 SENSITIVE Sensitive     IMIPENEM 1 SENSITIVE Sensitive     PIP/TAZO 16 SENSITIVE Sensitive     * >=100,000  COLONIES/mL PSEUDOMONAS AERUGINOSA  Culture, blood (Routine X 2) w Reflex to ID Panel     Status: None   Collection Time: 08/17/20  1:54 PM   Specimen: BLOOD LEFT HAND  Result Value Ref Range Status   Specimen Description BLOOD LEFT HAND  Final   Special Requests   Final    BOTTLES DRAWN AEROBIC AND ANAEROBIC Blood Culture adequate volume   Culture   Final    NO GROWTH 5 DAYS Performed at [facility], [address], [place], [place] [address]    Report Status 08/22/2020 FINAL  Final  Culture, blood (Routine X 2) w Reflex to ID Panel     Status: None   Collection Time: 08/17/20  2:15 PM   Specimen: BLOOD LEFT ARM  Result Value Ref Range Status   Specimen Description BLOOD LEFT ARM  Final   Special Requests   Final    BOTTLES DRAWN AEROBIC ONLY Blood Culture results may not be optimal due to an inadequate volume of blood received in culture bottles   Culture   Final    NO GROWTH 5 DAYS Performed at [facility],  [address] [place], [place] [address]    Report Status 08/22/2020 FINAL  Final  Culture, respiratory (non-expectorated)     Status: None   Collection Time: 08/17/20  6:14 PM   Specimen: Tracheal Aspirate; Respiratory  Result Value Ref Range Status   Specimen Description TRACHEAL ASPIRATE  Final   Special Requests NONE  Final   Gram Stain   Final    RARE WBC PRESENT, PREDOMINANTLY PMN FEW GRAM NEGATIVE RODS Performed at [facility], [address], [place], [place] [address]    Culture ABUNDANT PSEUDOMONAS AERUGINOSA  Final   Report Status 08/20/2020 FINAL  Final   Organism ID, Bacteria PSEUDOMONAS AERUGINOSA  Final      Susceptibility   Pseudomonas aeruginosa - MIC*    CEFTAZIDIME 4 SENSITIVE Sensitive     CIPROFLOXACIN <=0.25 SENSITIVE Sensitive     GENTAMICIN <=1 SENSITIVE Sensitive     IMIPENEM 1 SENSITIVE Sensitive     PIP/TAZO 8 SENSITIVE Sensitive     CEFEPIME 2 SENSITIVE Sensitive     * ABUNDANT PSEUDOMONAS AERUGINOSA  Culture, Urine     Status: Abnormal   Collection Time: 08/27/20 11:41 AM   Specimen: Urine, Random  Result Value Ref Range Status   Specimen Description URINE, RANDOM  Final   Special Requests   Final    NONE Performed at [facility], [address] [place], [place] [address]    Culture 70,000 COLONIES/mL YEAST (A)  Final   Report Status 08/28/2020 FINAL  Final  Culture, respiratory (non-expectorated)     Status: None   Collection Time: 08/27/20 12:10 PM   Specimen: Tracheal Aspirate; Respiratory  Result Value Ref Range Status   Specimen Description TRACHEAL ASPIRATE  Final   Special Requests NONE  Final   Gram Stain   Final    MODERATE WBC PRESENT, PREDOMINANTLY PMN NO ORGANISMS SEEN Performed at [facility], [address], [place], [place] [address]    Culture   Final    RARE METHICILLIN RESISTANT STAPHYLOCOCCUS AUREUS RARE PSEUDOMONAS AERUGINOSA    Report Status 08/30/2020 FINAL  Final   Organism ID, Bacteria  METHICILLIN RESISTANT STAPHYLOCOCCUS AUREUS  Final   Organism ID, Bacteria PSEUDOMONAS AERUGINOSA  Final      Susceptibility   Methicillin resistant staphylococcus aureus -  MIC*    CIPROFLOXACIN >=8 RESISTANT Resistant     ERYTHROMYCIN >=8 RESISTANT Resistant     GENTAMICIN <=0.5 SENSITIVE Sensitive     OXACILLIN >=4 RESISTANT Resistant     TETRACYCLINE <=1 SENSITIVE Sensitive     VANCOMYCIN <=0.5 SENSITIVE Sensitive     TRIMETH/SULFA <=10 SENSITIVE Sensitive     CLINDAMYCIN <=0.25 SENSITIVE Sensitive     RIFAMPIN <=0.5 SENSITIVE Sensitive     Inducible Clindamycin NEGATIVE Sensitive     * RARE METHICILLIN RESISTANT STAPHYLOCOCCUS AUREUS   Pseudomonas aeruginosa - MIC*    CEFTAZIDIME 16 INTERMEDIATE Intermediate     CIPROFLOXACIN 1 SENSITIVE Sensitive     GENTAMICIN <=1 SENSITIVE Sensitive     IMIPENEM 1 SENSITIVE Sensitive     * RARE PSEUDOMONAS AERUGINOSA  SARS CORONAVIRUS 2 (TAT 6-24 HRS) Nasopharyngeal Nasopharyngeal Swab     Status: Abnormal   Collection Time: 08/31/20  8:20 AM   Specimen: Nasopharyngeal Swab  Result Value Ref Range Status   SARS Coronavirus 2 POSITIVE (A) NEGATIVE Final    Comment: RESULT CALLED TO, READ BACK BY AND VERIFIED WITH: [facility] RN 13:55 08/31/20 ([email or web address]) (NOTE) SARS-CoV-2 target nucleic acids are DETECTED.  The SARS-CoV-2 RNA is generally detectable in upper and lower respiratory specimens during the acute phase of infection. Positive results are indicative of the presence of SARS-CoV-2 RNA. Clinical correlation with patient history and other diagnostic information is  necessary to determine patient infection status. Positive results do not rule out bacterial infection or co-infection with other viruses.  The expected result is Negative.  Fact Sheet for Patients: HairSlick.no  Fact Sheet for Healthcare Providers: quierodirigir.com  This test is not yet approved or cleared by the [place] FDA and  has been authorized for detection and/or diagnosis of SARS-CoV-2 by FDA under an Emergency Use Authorization (EUA). This EUA will remain  in effect (meaning this test can be used) for  the duration of the COVID-19 declaration under Section 564(b)(1) of the Act, 21 U.S.C. section 360bbb-3(b)(1), unless the authorization is terminated or revoked sooner.   Performed at [facility], [address] [place], [place] [address]   Novel Coronavirus, NAA (Hosp order, Send-out to Ref Lab; TAT 18-24 hrs     Status: None   Collection Time: 08/31/20  2:00 PM   Specimen: Nasopharyngeal Swab; Respiratory  Result Value Ref Range Status   SARS-CoV-2, NAA NOT DETECTED NOT DETECTED Final    Comment: (NOTE) This nucleic acid amplification test was developed and its performance characteristics determined by [employer]. Nucleic acid amplification tests include RT-PCR and TMA. This test has not been FDA cleared or approved. This test has been authorized by FDA under an Emergency Use Authorization (EUA). This test is only authorized for the duration of time the declaration that circumstances exist justifying the authorization of the emergency use of in vitro diagnostic tests for detection of SARS-CoV-2 virus and/or diagnosis of COVID-19 infection under section 564(b)(1) of the Act, 21 U.S.C. 948NIO-2(V) (1), unless the authorization is terminated or revoked sooner. When diagnostic testing is negative, the possibility of a false negative result should be considered in the context of a patient's recent exposures and the presence of clinical signs and symptoms consistent with COVID-19. An individual without symptoms of COVID- 19 and who is not shedding SARS-CoV-2  virus would expect to have a negative (not detected) result in this assay. Performed At: [facility] [address] [place], [place] [id number] Jolene Schimke MD WE:[id number]  Coronavirus Source  NASOPHARYNGEAL  Final    Comment: Performed at [facility], [address] [place], [place] [address]  Culture, blood (routine x 2)     Status: None (Preliminary result)   Collection Time: 09/09/20 11:29 AM   Specimen: BLOOD RIGHT HAND  Result Value Ref Range Status   Specimen Description BLOOD RIGHT HAND  Final   Special Requests   Final    BOTTLES DRAWN AEROBIC ONLY Blood Culture results may not be optimal due to an inadequate volume of blood received in culture bottles   Culture   Final    NO GROWTH 2 DAYS Performed at [facility], [address], [place], [place] [address]    Report Status PENDING  Incomplete  Culture, blood (routine x 2)     Status: Abnormal (Preliminary result)   Collection Time: 09/09/20 11:34 AM   Specimen: BLOOD LEFT HAND  Result Value Ref Range Status   Specimen Description BLOOD LEFT HAND  Final   Special Requests   Final    BOTTLES DRAWN AEROBIC ONLY Blood Culture results may not be optimal due to an inadequate volume of blood received in culture bottles   Culture  Setup Time   Final    GRAM POSITIVE COCCI IN CLUSTERS AEROBIC BOTTLE ONLY CRITICAL RESULT CALLED TO, READ BACK BY AND VERIFIED WITH: RN S HAN [id number] AT [id number] CM    Culture (A)  Final    STAPHYLOCOCCUS HAEMOLYTICUS THE SIGNIFICANCE OF ISOLATING THIS ORGANISM FROM A SINGLE VENIPUNCTURE CANNOT BE PREDICTED WITHOUT FURTHER CLINICAL AND CULTURE CORRELATION. SUSCEPTIBILITIES AVAILABLE ONLY ON REQUEST. Performed at [facility], [address] [place], [place] [address]    Report Status PENDING  Incomplete  Blood Culture ID Panel (Reflexed)     Status: Abnormal   Collection Time: 09/09/20 11:34 AM  Result Value Ref Range Status   Enterococcus faecalis NOT DETECTED NOT DETECTED Final   Enterococcus Faecium NOT DETECTED NOT DETECTED Final   Listeria monocytogenes NOT DETECTED NOT DETECTED Final   Staphylococcus species DETECTED (A) NOT DETECTED Final    Comment: CRITICAL  RESULT CALLED TO, READ BACK BY AND VERIFIED WITH: RN S HAN [id number] AT 1424 BY CM    Staphylococcus aureus (BCID) NOT DETECTED NOT DETECTED Final   Staphylococcus epidermidis NOT DETECTED NOT DETECTED Final   Staphylococcus lugdunensis NOT DETECTED NOT DETECTED Final   Streptococcus species NOT DETECTED NOT DETECTED Final   Streptococcus agalactiae NOT DETECTED NOT DETECTED Final   Streptococcus pneumoniae NOT DETECTED NOT DETECTED Final   Streptococcus pyogenes NOT DETECTED NOT DETECTED Final   A.calcoaceticus-baumannii NOT DETECTED NOT DETECTED Final   Bacteroides fragilis NOT DETECTED NOT DETECTED Final   Enterobacterales NOT DETECTED NOT DETECTED Final   Enterobacter cloacae complex NOT DETECTED NOT DETECTED Final   Escherichia coli NOT DETECTED NOT DETECTED Final   Klebsiella aerogenes NOT DETECTED NOT DETECTED Final   Klebsiella oxytoca NOT DETECTED NOT DETECTED Final   Klebsiella pneumoniae NOT DETECTED NOT DETECTED Final   Proteus species NOT DETECTED NOT DETECTED Final   Salmonella species NOT DETECTED NOT DETECTED Final   Serratia marcescens NOT DETECTED NOT DETECTED Final   Haemophilus influenzae NOT DETECTED NOT DETECTED Final   Neisseria meningitidis NOT DETECTED NOT DETECTED Final   Pseudomonas aeruginosa NOT DETECTED NOT DETECTED Final   Stenotrophomonas maltophilia NOT DETECTED NOT DETECTED Final   Candida albicans NOT DETECTED NOT DETECTED Final   Candida auris NOT DETECTED NOT DETECTED Final  Candida glabrata NOT DETECTED NOT DETECTED Final   Candida krusei NOT DETECTED NOT DETECTED Final   Candida parapsilosis NOT DETECTED NOT DETECTED Final   Candida tropicalis NOT DETECTED NOT DETECTED Final   Cryptococcus neoformans/gattii NOT DETECTED NOT DETECTED Final    Comment: Performed at [facility], [address] [place], [place] [address]  Culture, respiratory (non-expectorated)     Status: None (Preliminary result)   Collection Time: 09/09/20  6:05 PM    Specimen: Tracheal Aspirate; Respiratory  Result Value Ref Range Status   Specimen Description TRACHEAL ASPIRATE  Final   Special Requests NONE  Final   Gram Stain   Final    MODERATE WBC PRESENT, PREDOMINANTLY PMN ABUNDANT GRAM NEGATIVE RODS    Culture   Final    MODERATE PSEUDOMONAS AERUGINOSA SUSCEPTIBILITIES TO FOLLOW Performed at [facility], [address], [place], [place] [address]    Report Status PENDING  Incomplete  Culture, Urine     Status: Abnormal   Collection Time: 09/10/20  3:55 PM   Specimen: Urine, Random  Result Value Ref Range Status   Specimen Description URINE, RANDOM  Final   Special Requests   Final    NONE Performed at [facility], [address] [place], [place] [address]    Culture MULTIPLE SPECIES PRESENT, SUGGEST RECOLLECTION (A)  Final   Report Status 09/11/2020 FINAL  Final    Coagulation Studies: No results for input(s): LABPROT, INR in the last 72 hours.  Urinalysis: Recent Labs    09/10/20 1555  COLORURINE YELLOW  LABSPEC 1.011  PHURINE 7.0  GLUCOSEU NEGATIVE  HGBUR LARGE*  BILIRUBINUR NEGATIVE  KETONESUR NEGATIVE  PROTEINUR 100*  NITRITE NEGATIVE  LEUKOCYTESUR LARGE*      Imaging: DG Abd 1 View  Result Date: 09/11/2020 CLINICAL DATA:  PICC line EXAM: ABDOMEN - 1 VIEW COMPARISON:  August 28, 2020 FINDINGS: Peg tube projected over the gastric shadow. No PICC line seen. No bowel obstruction. No other acute abnormalities. IMPRESSION: PEG tube projects over the gastric shadow. No PICC line identified. No acute abnormalities. Electronically Signed   By: Gerome Sam III M.D   On: 09/11/2020 12:43   DG CHEST PORT 1 VIEW  Result Date: 09/12/2020 CLINICAL DATA:  Tracheostomy tube and chest tube in place. Respiratory failure. EXAM: PORTABLE CHEST 1 VIEW COMPARISON:  09/11/2020 FINDINGS: Tracheostomy tube tip is stable above the carina. Dual lumen right IJ catheter is identified with tip at the right atrium. Smaller  bore dual lumen catheter is also noted with tip at the right atrium. Right lateral base chest tube appears to be pulled back closer to the lateral chest wall. Right-sided pneumothorax is increased from previous exam with increase in lateral, basilar, and medial components. Unchanged appearance of diffuse bilateral interstitial and airspace densities compatible with COVID-19 pneumonia. IMPRESSION: 1. Interval increase in size of known right-sided pneumothorax. The right-sided chest tube appears to have been pulled back closer to the lateral chest wall and may need repositioning. 2. Unchanged bilateral interstitial and airspace densities compatible with COVID-19 pneumonia. These results will be called to the ordering clinician or representative by the Radiologist Assistant, and communication documented in the PACS or [employer]. Electronically Signed   By: Signa Kell M.D.   On: 09/12/2020 06:33   DG CHEST PORT 1 VIEW  Result Date: 09/11/2020 CLINICAL DATA:  Evaluate PICC line EXAM: PORTABLE CHEST 1 VIEW COMPARISON:  September 11, 2020 at 11:53 a.m. FINDINGS: The  right PICC line is been adjusted. The distal tip now terminates near the caval atrial junction. The right central line and tracheostomy tube are stable. The right chest tube is stable. Stable right small pneumothorax. Stable bilateral pulmonary infiltrates. No other changes. IMPRESSION: 1. The right PICC line has been adjusted and now terminates near the caval atrial junction. Otherwise, stable support apparatus. 2. Stable small right pneumothorax. 3. Stable bilateral pulmonary infiltrates. Electronically Signed   By: Gerome Samavid  Williams III M.D   On: 09/11/2020 12:44   DG CHEST PORT 1 VIEW  Result Date: 09/11/2020 CLINICAL DATA:  PICC line placement. EXAM: PORTABLE CHEST 1 VIEW COMPARISON:  September 11, 2020 FINDINGS: A right chest tube remains in place. A small right apical pneumothorax remains, unchanged. Stable tracheostomy tube. Stable right  central line terminating in the right side of the atrium. The new right PICC line reaches the SVC but the distal tip curves back on itself. Stable cardiomediastinal silhouette. Diffuse bilateral pulmonary infiltrates are stable to worsened in the interval. IMPRESSION: 1. The new right PICC line distal tip flipped back on itself. Recommend repositioning. 2. Other support apparatus as above. 3. Persistent small right-sided pneumothorax, unchanged. 4. Diffuse bilateral pulmonary infiltrates are stable to mildly worsened in the interval. Electronically Signed   By: Gerome Samavid  Williams III M.D   On: 09/11/2020 12:42   DG CHEST PORT 1 VIEW  Result Date: 09/11/2020 CLINICAL DATA:  Follow-up pneumothorax. EXAM: PORTABLE CHEST 1 VIEW COMPARISON:  09/10/2020 and earlier exams. FINDINGS: Tiny right apical pneumothorax is without change from the previous day's exam. Stable inferior right hemithorax chest tube. Bilateral airspace lung opacities are also unchanged. No new lung abnormalities. Stable tracheostomy tube and right internal jugular dual-lumen central venous catheter. IMPRESSION: 1. No change from the previous day's exam. 2. Persistent small right apical pneumothorax. Stable right inferior hemithorax chest tube. 3. No change in the bilateral airspace lung opacities. Electronically Signed   By: Amie Portlandavid  Ormond M.D.   On: 09/11/2020 07:49   DG CHEST PORT 1 VIEW  Result Date: 09/10/2020 CLINICAL DATA:  Follow-up pneumothorax. EXAM: PORTABLE CHEST 1 VIEW COMPARISON:  09/09/2020 FINDINGS: Stable position of the right pigtail chest tube in the right lower chest. There is a small right apical pneumothorax. Right jugular dialysis catheter extends into the right atrium and stable. Patient has a tracheostomy tube. Diffuse bilateral parenchymal lung densities are unchanged. Decreased subcutaneous edema. Heart size remains enlarged. IMPRESSION: 1. Small right apical pneumothorax. Stable position of the right chest tube.  Decreased subcutaneous gas. 2. Stable position of the dialysis catheter with the tip in the right atrium. 3. No significant change in the bilateral parenchymal lung densities. Electronically Signed   By: Richarda OverlieAdam  Henn M.D.   On: 09/10/2020 08:47     Medications:       Assessment/ Plan:  74 y.o. female with a PMHx of recent acute respiratory failure secondary to COVID-19 pneumonia, hypertension, hyperlipidemia, diabetes mellitus type 2, depression, anxiety, obesity, atrial fibrillation, history of MRSA bacteremia and fungal pneumonia, recent acute kidney injury, who was admitted to [facility] on 08/31/2020 for ongoing treatment of acute respiratory failure.   1.  Acute kidney injury.    Renal function remains quite poor.  Urine output only 50 cc over the preceding 24 hours.  Resume dialysis treatment today.  2.  Hyponatremia.  Sodium down a bit to 133 today.  Should correct with dialysis.  3.  Acute respiratory failure.  Patient now with tension pneumothorax which has  improved significantly post chest tube placement.  4.  Generalized edema/volume overload.  Resume ultrafiltration with dialysis today.  Patient currently on norepinephrine.     LOS: 0 Indica Marcott 11/1/20217:50 AM

## 2020-09-12 NOTE — Progress Notes (Signed)
   Now with further increase in sub cu air Priya NP in [facility] calls for IR to evaluate and possible exchange Rt chest tube  Dr Grace Isaac has reviewed imaging and chart We will plan for right chest tube exchange in IR today  Consent gained via phone with Dtr Tillie Fantasia

## 2020-09-12 NOTE — Progress Notes (Signed)
Pulmonary Critical Care Medicine [facility] [place]   PULMONARY CRITICAL CARE SERVICE  PROGRESS NOTE  Date of Service: 09/12/2020  Aimee Peck  HDQ:[id number]  DOB: 09/02/1946   DOA: 09/02/2020  Referring Physician: Carron Curie, MD  HPI: Aimee Peck is a 74 y.o. female seen for follow up of Acute on Chronic Respiratory Failure. Patient is on full support on assist control mode currently is on 70% FiO2 patient has been noted to have an interval increase of the right-sided pneumothorax.  I noted that the patient's ventilator settings were on assist control volume control with a inspiratory time of 0.9 spoke with respiratory therapy as this is giving [place] a IV ratio one-to-one to try to adjust the ventilator so that we give her more exhalation time and try to decompress her.  The chest to better also been pulled back and the primary care team asked interventional radiology to review.  Medications: Reviewed on Rounds  Physical Exam:  Vitals: Temperature 95.1 pulse 73 respiratory rate 29 blood pressure is 133/55 saturations 95%  Ventilator Settings on assist control FiO2 is 70% tidal volume is 460 PEEP 7  . General: Comfortable at this time . Eyes: Grossly normal lids, irises & conjunctiva . ENT: grossly tongue is normal . Neck: no obvious mass . Cardiovascular: S1 S2 normal no gallop . Respiratory: Scattered rhonchi expansion is equal . Abdomen: soft . Skin: no rash seen on limited exam . Musculoskeletal: not rigid . Psychiatric:unable to assess . Neurologic: no seizure no involuntary movements         Lab Data:   Basic Metabolic Panel: Recent Labs  Lab 09/07/20 0446 09/09/20 0505 09/10/20 0650 09/11/20 1317 09/12/20 0617  NA 135 137 136 133* 133*  K 3.4* 3.8 3.8 3.9 4.0  CL 97* 99 99 95* 97*  CO2 25 27 26 25 23   GLUCOSE 62* 122* 72 140* 140*  BUN 41* 37* 58* 78* 89*  CREATININE 1.47* 1.45* 1.78* 2.15* 2.36*  CALCIUM 8.6* 8.4* 8.2* 8.5* 8.4*  MG   --   --  1.9 1.9  --   PHOS 4.0 3.7 4.9* 5.6* 6.1*    ABG: Recent Labs  Lab 09/09/20 0824 09/10/20 0510 09/11/20 1245  PHART 7.360 7.321* 7.299*  PCO2ART 50.5* 52.6* 51.5*  PO2ART 49.6* 76.4* 58.6*  HCO3 27.6 26.4 24.5  O2SAT 85.3 95.8 89.7    Liver Function Tests: Recent Labs  Lab 09/07/20 0446 09/09/20 0505 09/10/20 0650 09/11/20 1317 09/12/20 0617  ALBUMIN 2.4* 2.5* 2.1* 2.0* 1.8*   No results for input(s): LIPASE, AMYLASE in the last 168 hours. No results for input(s): AMMONIA in the last 168 hours.  CBC: Recent Labs  Lab 09/07/20 0446 09/09/20 0505 09/10/20 0650 09/11/20 1317 09/12/20 0617  WBC 8.1 6.8 9.4 9.3 7.7  HGB 8.7* 8.6* 8.4* 7.9* 7.7*  HCT 28.1* 28.3* 27.7* 26.0* 25.0*  MCV 96.6 96.9 98.2 97.4 98.4  PLT 133* 128* 96* 128* 130*    Cardiac Enzymes: No results for input(s): CKTOTAL, CKMB, CKMBINDEX, TROPONINI in the last 168 hours.  BNP (last 3 results) No results for input(s): BNP in the last 8760 hours.  ProBNP (last 3 results) No results for input(s): PROBNP in the last 8760 hours.  Radiological Exams: DG Abd 1 View  Result Date: 09/11/2020 CLINICAL DATA:  PICC line EXAM: ABDOMEN - 1 VIEW COMPARISON:  August 28, 2020 FINDINGS: Peg tube projected over the gastric shadow. No PICC line seen. No bowel obstruction. No other  acute abnormalities. IMPRESSION: PEG tube projects over the gastric shadow. No PICC line identified. No acute abnormalities. Electronically Signed   By: Gerome Sam III M.D   On: 09/11/2020 12:43   DG CHEST PORT 1 VIEW  Result Date: 09/12/2020 CLINICAL DATA:  Tracheostomy tube and chest tube in place. Respiratory failure. EXAM: PORTABLE CHEST 1 VIEW COMPARISON:  09/11/2020 FINDINGS: Tracheostomy tube tip is stable above the carina. Dual lumen right IJ catheter is identified with tip at the right atrium. Smaller bore dual lumen catheter is also noted with tip at the right atrium. Right lateral base chest tube appears to  be pulled back closer to the lateral chest wall. Right-sided pneumothorax is increased from previous exam with increase in lateral, basilar, and medial components. Unchanged appearance of diffuse bilateral interstitial and airspace densities compatible with COVID-19 pneumonia. IMPRESSION: 1. Interval increase in size of known right-sided pneumothorax. The right-sided chest tube appears to have been pulled back closer to the lateral chest wall and may need repositioning. 2. Unchanged bilateral interstitial and airspace densities compatible with COVID-19 pneumonia. These results will be called to the ordering clinician or representative by the Radiologist Assistant, and communication documented in the PACS or [employer]. Electronically Signed   By: Signa Kell M.D.   On: 09/12/2020 06:33   DG CHEST PORT 1 VIEW  Result Date: 09/11/2020 CLINICAL DATA:  Evaluate PICC line EXAM: PORTABLE CHEST 1 VIEW COMPARISON:  September 11, 2020 at 11:53 a.m. FINDINGS: The right PICC line is been adjusted. The distal tip now terminates near the caval atrial junction. The right central line and tracheostomy tube are stable. The right chest tube is stable. Stable right small pneumothorax. Stable bilateral pulmonary infiltrates. No other changes. IMPRESSION: 1. The right PICC line has been adjusted and now terminates near the caval atrial junction. Otherwise, stable support apparatus. 2. Stable small right pneumothorax. 3. Stable bilateral pulmonary infiltrates. Electronically Signed   By: Gerome Sam III M.D   On: 09/11/2020 12:44   DG CHEST PORT 1 VIEW  Result Date: 09/11/2020 CLINICAL DATA:  PICC line placement. EXAM: PORTABLE CHEST 1 VIEW COMPARISON:  September 11, 2020 FINDINGS: A right chest tube remains in place. A small right apical pneumothorax remains, unchanged. Stable tracheostomy tube. Stable right central line terminating in the right side of the atrium. The new right PICC line reaches the SVC but the  distal tip curves back on itself. Stable cardiomediastinal silhouette. Diffuse bilateral pulmonary infiltrates are stable to worsened in the interval. IMPRESSION: 1. The new right PICC line distal tip flipped back on itself. Recommend repositioning. 2. Other support apparatus as above. 3. Persistent small right-sided pneumothorax, unchanged. 4. Diffuse bilateral pulmonary infiltrates are stable to mildly worsened in the interval. Electronically Signed   By: Gerome Sam III M.D   On: 09/11/2020 12:42   DG CHEST PORT 1 VIEW  Result Date: 09/11/2020 CLINICAL DATA:  Follow-up pneumothorax. EXAM: PORTABLE CHEST 1 VIEW COMPARISON:  09/10/2020 and earlier exams. FINDINGS: Tiny right apical pneumothorax is without change from the previous day's exam. Stable inferior right hemithorax chest tube. Bilateral airspace lung opacities are also unchanged. No new lung abnormalities. Stable tracheostomy tube and right internal jugular dual-lumen central venous catheter. IMPRESSION: 1. No change from the previous day's exam. 2. Persistent small right apical pneumothorax. Stable right inferior hemithorax chest tube. 3. No change in the bilateral airspace lung opacities. Electronically Signed   By: Amie Portland M.D.   On: 09/11/2020 07:49  Assessment/Plan Active Problems:   Acute on chronic respiratory failure with hypoxia (HCC)   COVID-19 virus infection   Atrial fibrillation with RVR (HCC)   Acute renal failure due to tubular necrosis (HCC)   Metabolic encephalopathy   1. Acute on chronic respiratory failure hypoxia patient's oxygen requirements are up to 70% chest tube positioning as already discussed above the IR PA was appear she looked at it and appears to be in adequate position however I have spoken with respiratory therapy to adjust the ventilator settings. 2. COVID-19 virus infection in recovery we will continue to follow 3. Atrial fibrillation with rapid ventricular response no change continue  supportive care 4. Acute renal failure following labs. 5. Metabolic encephalopathy no change 6. Pneumothorax there was some increase in the pneumothorax from yesterday's films.  Chest tube will be continued on suction and ventilator settings will be adjusted.   I have personally seen and evaluated the patient, evaluated laboratory and imaging results, formulated the assessment and plan and placed orders. The Patient requires high complexity decision making with multiple systems involvement.  Rounds were done with the Respiratory Therapy Director and Staff therapists and discussed with nursing staff also.  Yevonne Pax, MD [facility] Pulmonary Critical Care Medicine Sleep Medicine

## 2020-09-12 DEATH — deceased

## 2020-09-13 ENCOUNTER — Other Ambulatory Visit (HOSPITAL_COMMUNITY): Payer: Medicare Other

## 2020-09-13 DIAGNOSIS — U071 COVID-19: Secondary | ICD-10-CM | POA: Diagnosis not present

## 2020-09-13 DIAGNOSIS — I4891 Unspecified atrial fibrillation: Secondary | ICD-10-CM | POA: Diagnosis not present

## 2020-09-13 DIAGNOSIS — N170 Acute kidney failure with tubular necrosis: Secondary | ICD-10-CM | POA: Diagnosis not present

## 2020-09-13 DIAGNOSIS — J9621 Acute and chronic respiratory failure with hypoxia: Secondary | ICD-10-CM | POA: Diagnosis not present

## 2020-09-13 NOTE — Progress Notes (Signed)
Pulmonary Critical Care Medicine [facility] [place]   PULMONARY CRITICAL CARE SERVICE  PROGRESS NOTE  Date of Service: 09/13/2020  Aimee Peck  NLZ:[id number]  DOB: 1945/11/14   DOA: 08/24/2020  Referring Physician: Carron Curie, MD  HPI: Aimee Peck is a 74 y.o. female seen for follow up of Acute on Chronic Respiratory Failure.  Remains on the ventilator on full support has chest tube in place right now is on assist control mode requiring 70% FiO2.  Medications: Reviewed on Rounds  Physical Exam:  Vitals: Temperature is 96.8 pulse 83 respiratory rate 30 blood pressure is 97/54 saturations 92%  Ventilator Settings on assist control FiO2 70% PEEP of 7 patient is on full support   General: Comfortable at this time  Eyes: Grossly normal lids, irises & conjunctiva  ENT: grossly tongue is normal  Neck: no obvious mass  Cardiovascular: S1 S2 normal no gallop  Respiratory: Scattered coarse breath sounds are noted bilaterally  Abdomen: soft  Skin: no rash seen on limited exam  Musculoskeletal: not rigid  Psychiatric:unable to assess  Neurologic: no seizure no involuntary movements         Lab Data:   Basic Metabolic Panel: Recent Labs  Lab 09/07/20 0446 09/09/20 0505 09/10/20 0650 09/11/20 1317 09/12/20 0617  NA 135 137 136 133* 133*  K 3.4* 3.8 3.8 3.9 4.0  CL 97* 99 99 95* 97*  CO2 25 27 26 25 23   GLUCOSE 62* 122* 72 140* 140*  BUN 41* 37* 58* 78* 89*  CREATININE 1.47* 1.45* 1.78* 2.15* 2.36*  CALCIUM 8.6* 8.4* 8.2* 8.5* 8.4*  MG  --   --  1.9 1.9  --   PHOS 4.0 3.7 4.9* 5.6* 6.1*    ABG: Recent Labs  Lab 09/09/20 0824 09/10/20 0510 09/11/20 1245  PHART 7.360 7.321* 7.299*  PCO2ART 50.5* 52.6* 51.5*  PO2ART 49.6* 76.4* 58.6*  HCO3 27.6 26.4 24.5  O2SAT 85.3 95.8 89.7    Liver Function Tests: Recent Labs  Lab 09/07/20 0446 09/09/20 0505 09/10/20 0650 09/11/20 1317 09/12/20 0617  ALBUMIN 2.4* 2.5* 2.1* 2.0* 1.8*    No results for input(s): LIPASE, AMYLASE in the last 168 hours. No results for input(s): AMMONIA in the last 168 hours.  CBC: Recent Labs  Lab 09/07/20 0446 09/09/20 0505 09/10/20 0650 09/11/20 1317 09/12/20 0617  WBC 8.1 6.8 9.4 9.3 7.7  HGB 8.7* 8.6* 8.4* 7.9* 7.7*  HCT 28.1* 28.3* 27.7* 26.0* 25.0*  MCV 96.6 96.9 98.2 97.4 98.4  PLT 133* 128* 96* 128* 130*    Cardiac Enzymes: No results for input(s): CKTOTAL, CKMB, CKMBINDEX, TROPONINI in the last 168 hours.  BNP (last 3 results) No results for input(s): BNP in the last 8760 hours.  ProBNP (last 3 results) No results for input(s): PROBNP in the last 8760 hours.  Radiological Exams: IR Catheter Tube Change  Result Date: 09/13/2020 CLINICAL DATA:  History of COVID pneumonia complicated by development of pneumothorax, post bedside chest tube placement on 09/09/2020. Given chest tube retraction and worsening subcutaneous emphysema, request made for fluoroscopic guided chest tube exchange and repositioning. EXAM: IR CATHETER TUBE CHANGE COMPARISON:  Chest radiograph-09/09/2020; 09/10/2020; 09/11/2020; 09/12/2020 CONTRAST:  None MEDICATIONS: None. ANESTHESIA/SEDATION: None FLUOROSCOPY TIME:  18 seconds (5 mGy) TECHNIQUE: Patient was positioned supine on the fluoroscopy table. The external portion of the existing percutaneous drainage catheter as well as the surrounding skin was prepped and draped in usual sterile fashion. A preprocedural spot fluoroscopic image was obtained of  the basilar aspect of the right hemithorax and the existing percutaneous drainage catheter. The external portion of the percutaneous drainage catheter was cut and cannulated with a short Amplatz wire. Under intermittent fluoroscopic guidance, the existing percutaneous drainage catheter was exchanged for a new 14 French percutaneous drainage catheter with end coiled and locked within the more cranial aspect of the right pleural space. The percutaneous drainage  catheter was connected to a pleura vac device and secured in place with 2 interrupted sutures and a StatLock device. A Vaseline gauze dressing was applied. The patient tolerated the procedure well without immediate postprocedural complication. FINDINGS: Preprocedural spot fluoroscopic image demonstrates right basilar chest tube overlying the right mid/lower hemithorax with large amount of subcutaneous emphysema and small to moderate-sized right-sided pneumothorax. Following fluoroscopic guided exchange, the new right-sided chest tube is better positioned within the more cranial aspect of the right pleural space with reduction in size of right-sided pneumothorax. IMPRESSION: Successful fluoroscopic guided exchange and repositioning of 14 French right-sided chest tube. PLAN: - Further management of the right-sided chest tube as per the clinical service Electronically Signed   By: Simonne Come M.D.   On: 09/13/2020 12:03   DG Chest 1V REPEAT Same Day  Result Date: 09/12/2020 CLINICAL DATA:  Hypoxia, respiratory distress, vomiting EXAM: CHEST - 1 VIEW SAME DAY COMPARISON:  09/12/2020 at 0555 hours FINDINGS: Small right apical pneumothorax, unchanged. Indwelling right pigtail chest tube/drain at the right lung base. Extensive subcutaneous emphysema along the right lateral chest wall, left lateral chest wall, and bilateral supraclavicular regions, new. Underlying bilateral pulmonary opacities, obscured by subcutaneous emphysema. The heart is mildly enlarged.  Suspected pneumomediastinum. Tracheostomy in satisfactory position. Right subclavian and right IJ venous catheters terminating in the upper right atrium. IMPRESSION: Small right apical pneumothorax, unchanged. Indwelling right pigtail chest tube/drain at the right lung base. Extensive subcutaneous emphysema, new. Electronically Signed   By: Charline Bills M.D.   On: 09/12/2020 14:56   DG CHEST PORT 1 VIEW  Result Date: 09/13/2020 CLINICAL DATA:  Pneumonia.   Pneumothorax.  Chest tube.  COVID. EXAM: PORTABLE CHEST 1 VIEW COMPARISON:  09/12/2020. FINDINGS: Tracheostomy tube, right central lines, right chest tube in stable position. Interim improvement of right-sided pneumothorax with mild right apical residual. Extensive bilateral chest wall subcutaneous emphysema again noted. Diffuse bilateral interstitial infiltrates again noted without interim change. Heart size stable. IMPRESSION: 1. Lines and tubes including right chest tube in stable position. Interim improvement of right-sided pneumothorax with mild right apical residual. Extensive bilateral chest wall subcutaneous emphysema again noted. 2. Diffuse bilateral interstitial infiltrates again noted without interim change. Electronically Signed   By: Maisie Fus  Register   On: 09/13/2020 06:09   DG CHEST PORT 1 VIEW  Result Date: 09/12/2020 CLINICAL DATA:  Tracheostomy tube and chest tube in place. Respiratory failure. EXAM: PORTABLE CHEST 1 VIEW COMPARISON:  09/11/2020 FINDINGS: Tracheostomy tube tip is stable above the carina. Dual lumen right IJ catheter is identified with tip at the right atrium. Smaller bore dual lumen catheter is also noted with tip at the right atrium. Right lateral base chest tube appears to be pulled back closer to the lateral chest wall. Right-sided pneumothorax is increased from previous exam with increase in lateral, basilar, and medial components. Unchanged appearance of diffuse bilateral interstitial and airspace densities compatible with COVID-19 pneumonia. IMPRESSION: 1. Interval increase in size of known right-sided pneumothorax. The right-sided chest tube appears to have been pulled back closer to the lateral chest wall and may need  repositioning. 2. Unchanged bilateral interstitial and airspace densities compatible with COVID-19 pneumonia. These results will be called to the ordering clinician or representative by the Radiologist Assistant, and communication documented in the PACS or  [employer]. Electronically Signed   By: Signa Kell M.D.   On: 09/12/2020 06:33   DG Abd Portable 1V  Result Date: 09/12/2020 CLINICAL DATA:  Hypoxia, respiratory distress, vomiting EXAM: PORTABLE ABDOMEN - 1 VIEW COMPARISON:  09/11/2020 FINDINGS: Gastrostomy overlies the stomach. Nonobstructive bowel gas pattern. Mild degenerative changes of the lumbar spine. IMPRESSION: Gastrostomy overlies the stomach. Electronically Signed   By: Charline Bills M.D.   On: 09/12/2020 14:56    Assessment/Plan Active Problems:   Acute on chronic respiratory failure with hypoxia (HCC)   COVID-19 virus infection   Atrial fibrillation with RVR (HCC)   Acute renal failure due to tubular necrosis (HCC)   Metabolic encephalopathy   1. Acute on chronic respiratory failure with hypoxia patient is going to be continued on full support on the ventilator.  New chest tube placed him for the pneumothorax as fairly large amounts of subcutaneous air not tolerating any weaning and will be held 2. COVID-19 virus infection in recovery we will continue with supportive care 3. Atrial fibrillation rate is controlled 4. Acute renal failure with tubular necrosis we will continue with supportive care 5. Metabolic encephalopathy no change   I have personally seen and evaluated the patient, evaluated laboratory and imaging results, formulated the assessment and plan and placed orders. The Patient requires high complexity decision making with multiple systems involvement.  Rounds were done with the Respiratory Therapy Director and Staff therapists and discussed with nursing staff also.  Yevonne Pax, MD [facility] Pulmonary Critical Care Medicine Sleep Medicine

## 2020-09-13 NOTE — Progress Notes (Signed)
Referring Physician(s): Camelia Eng, NP  Supervising Physician: Irish Lack  Patient Status:  [facility]   Chief Complaint: Right pneumothorax s/p right chest tube exchange 09/12/20 by Dr. Grace Isaac   Subjective: Eyes open, non-verbal, unresponsive, does not track. Tracheostomy in place. Right chest tube intact.   CXR 09/13/20: IMPRESSION: 1. Lines and tubes including right chest tube in stable position. Interim improvement of right-sided pneumothorax with mild right apical residual. Extensive bilateral chest wall subcutaneous emphysema again noted. 2. Diffuse bilateral interstitial infiltrates again noted without interim change.  Allergies: Patient has no allergy information on record.  Medications: Prior to Admission medications   Not on File     Vital Signs: HR 92, BP 111/62, RR 38, O2 sats 94% trach/vent. 70% FiO2, PEEP 7  Physical Exam Constitutional:      Appearance: She is ill-appearing.  Cardiovascular:     Rate and Rhythm: Normal rate and regular rhythm.  Pulmonary:     Breath sounds: Rhonchi present.     Comments: Trach/vent. Right chest tube intact. 1/7 air leak. Serosanguineous output. Site is clean and dry.  Skin:    Comments: Diffuse weeping     Imaging: DG Abd 1 View  Result Date: 09/11/2020 CLINICAL DATA:  PICC line EXAM: ABDOMEN - 1 VIEW COMPARISON:  August 28, 2020 FINDINGS: Peg tube projected over the gastric shadow. No PICC line seen. No bowel obstruction. No other acute abnormalities. IMPRESSION: PEG tube projects over the gastric shadow. No PICC line identified. No acute abnormalities. Electronically Signed   By: Gerome Sam III M.D   On: 09/11/2020 12:43   IR Catheter Tube Change  Result Date: 09/13/2020 CLINICAL DATA:  History of COVID pneumonia complicated by development of pneumothorax, post bedside chest tube placement on 09/09/2020. Given chest tube retraction and worsening subcutaneous emphysema, request made  for fluoroscopic guided chest tube exchange and repositioning. EXAM: IR CATHETER TUBE CHANGE COMPARISON:  Chest radiograph-09/09/2020; 09/10/2020; 09/11/2020; 09/12/2020 CONTRAST:  None MEDICATIONS: None. ANESTHESIA/SEDATION: None FLUOROSCOPY TIME:  18 seconds (5 mGy) TECHNIQUE: Patient was positioned supine on the fluoroscopy table. The external portion of the existing percutaneous drainage catheter as well as the surrounding skin was prepped and draped in usual sterile fashion. A preprocedural spot fluoroscopic image was obtained of the basilar aspect of the right hemithorax and the existing percutaneous drainage catheter. The external portion of the percutaneous drainage catheter was cut and cannulated with a short Amplatz wire. Under intermittent fluoroscopic guidance, the existing percutaneous drainage catheter was exchanged for a new 14 French percutaneous drainage catheter with end coiled and locked within the more cranial aspect of the right pleural space. The percutaneous drainage catheter was connected to a pleura vac device and secured in place with 2 interrupted sutures and a StatLock device. A Vaseline gauze dressing was applied. The patient tolerated the procedure well without immediate postprocedural complication. FINDINGS: Preprocedural spot fluoroscopic image demonstrates right basilar chest tube overlying the right mid/lower hemithorax with large amount of subcutaneous emphysema and small to moderate-sized right-sided pneumothorax. Following fluoroscopic guided exchange, the new right-sided chest tube is better positioned within the more cranial aspect of the right pleural space with reduction in size of right-sided pneumothorax. IMPRESSION: Successful fluoroscopic guided exchange and repositioning of 14 French right-sided chest tube. PLAN: - Further management of the right-sided chest tube as per the clinical service Electronically Signed   By: Simonne Come M.D.   On: 09/13/2020 12:03   DG Chest  1V REPEAT Same Day  Result Date: 09/12/2020 CLINICAL DATA:  Hypoxia, respiratory distress, vomiting EXAM: CHEST - 1 VIEW SAME DAY COMPARISON:  09/12/2020 at 0555 hours FINDINGS: Small right apical pneumothorax, unchanged. Indwelling right pigtail chest tube/drain at the right lung base. Extensive subcutaneous emphysema along the right lateral chest wall, left lateral chest wall, and bilateral supraclavicular regions, new. Underlying bilateral pulmonary opacities, obscured by subcutaneous emphysema. The heart is mildly enlarged.  Suspected pneumomediastinum. Tracheostomy in satisfactory position. Right subclavian and right IJ venous catheters terminating in the upper right atrium. IMPRESSION: Small right apical pneumothorax, unchanged. Indwelling right pigtail chest tube/drain at the right lung base. Extensive subcutaneous emphysema, new. Electronically Signed   By: Charline Bills M.D.   On: 09/12/2020 14:56   DG CHEST PORT 1 VIEW  Result Date: 09/13/2020 CLINICAL DATA:  Pneumonia.  Pneumothorax.  Chest tube.  COVID. EXAM: PORTABLE CHEST 1 VIEW COMPARISON:  09/12/2020. FINDINGS: Tracheostomy tube, right central lines, right chest tube in stable position. Interim improvement of right-sided pneumothorax with mild right apical residual. Extensive bilateral chest wall subcutaneous emphysema again noted. Diffuse bilateral interstitial infiltrates again noted without interim change. Heart size stable. IMPRESSION: 1. Lines and tubes including right chest tube in stable position. Interim improvement of right-sided pneumothorax with mild right apical residual. Extensive bilateral chest wall subcutaneous emphysema again noted. 2. Diffuse bilateral interstitial infiltrates again noted without interim change. Electronically Signed   By: Maisie Fus  Register   On: 09/13/2020 06:09   DG CHEST PORT 1 VIEW  Result Date: 09/12/2020 CLINICAL DATA:  Tracheostomy tube and chest tube in place. Respiratory failure. EXAM: PORTABLE  CHEST 1 VIEW COMPARISON:  09/11/2020 FINDINGS: Tracheostomy tube tip is stable above the carina. Dual lumen right IJ catheter is identified with tip at the right atrium. Smaller bore dual lumen catheter is also noted with tip at the right atrium. Right lateral base chest tube appears to be pulled back closer to the lateral chest wall. Right-sided pneumothorax is increased from previous exam with increase in lateral, basilar, and medial components. Unchanged appearance of diffuse bilateral interstitial and airspace densities compatible with COVID-19 pneumonia. IMPRESSION: 1. Interval increase in size of known right-sided pneumothorax. The right-sided chest tube appears to have been pulled back closer to the lateral chest wall and may need repositioning. 2. Unchanged bilateral interstitial and airspace densities compatible with COVID-19 pneumonia. These results will be called to the ordering clinician or representative by the Radiologist Assistant, and communication documented in the PACS or [employer]. Electronically Signed   By: Signa Kell M.D.   On: 09/12/2020 06:33   DG CHEST PORT 1 VIEW  Result Date: 09/11/2020 CLINICAL DATA:  Evaluate PICC line EXAM: PORTABLE CHEST 1 VIEW COMPARISON:  September 11, 2020 at 11:53 a.m. FINDINGS: The right PICC line is been adjusted. The distal tip now terminates near the caval atrial junction. The right central line and tracheostomy tube are stable. The right chest tube is stable. Stable right small pneumothorax. Stable bilateral pulmonary infiltrates. No other changes. IMPRESSION: 1. The right PICC line has been adjusted and now terminates near the caval atrial junction. Otherwise, stable support apparatus. 2. Stable small right pneumothorax. 3. Stable bilateral pulmonary infiltrates. Electronically Signed   By: Gerome Sam III M.D   On: 09/11/2020 12:44   DG CHEST PORT 1 VIEW  Result Date: 09/11/2020 CLINICAL DATA:  PICC line placement. EXAM: PORTABLE CHEST  1 VIEW COMPARISON:  September 11, 2020 FINDINGS: A right chest tube remains in place. A small right apical  pneumothorax remains, unchanged. Stable tracheostomy tube. Stable right central line terminating in the right side of the atrium. The new right PICC line reaches the SVC but the distal tip curves back on itself. Stable cardiomediastinal silhouette. Diffuse bilateral pulmonary infiltrates are stable to worsened in the interval. IMPRESSION: 1. The new right PICC line distal tip flipped back on itself. Recommend repositioning. 2. Other support apparatus as above. 3. Persistent small right-sided pneumothorax, unchanged. 4. Diffuse bilateral pulmonary infiltrates are stable to mildly worsened in the interval. Electronically Signed   By: Gerome Samavid  Williams III M.D   On: 09/11/2020 12:42   DG CHEST PORT 1 VIEW  Result Date: 09/11/2020 CLINICAL DATA:  Follow-up pneumothorax. EXAM: PORTABLE CHEST 1 VIEW COMPARISON:  09/10/2020 and earlier exams. FINDINGS: Tiny right apical pneumothorax is without change from the previous day's exam. Stable inferior right hemithorax chest tube. Bilateral airspace lung opacities are also unchanged. No new lung abnormalities. Stable tracheostomy tube and right internal jugular dual-lumen central venous catheter. IMPRESSION: 1. No change from the previous day's exam. 2. Persistent small right apical pneumothorax. Stable right inferior hemithorax chest tube. 3. No change in the bilateral airspace lung opacities. Electronically Signed   By: Amie Portlandavid  Ormond M.D.   On: 09/11/2020 07:49   DG CHEST PORT 1 VIEW  Result Date: 09/10/2020 CLINICAL DATA:  Follow-up pneumothorax. EXAM: PORTABLE CHEST 1 VIEW COMPARISON:  09/09/2020 FINDINGS: Stable position of the right pigtail chest tube in the right lower chest. There is a small right apical pneumothorax. Right jugular dialysis catheter extends into the right atrium and stable. Patient has a tracheostomy tube. Diffuse bilateral parenchymal lung  densities are unchanged. Decreased subcutaneous edema. Heart size remains enlarged. IMPRESSION: 1. Small right apical pneumothorax. Stable position of the right chest tube. Decreased subcutaneous gas. 2. Stable position of the dialysis catheter with the tip in the right atrium. 3. No significant change in the bilateral parenchymal lung densities. Electronically Signed   By: Richarda OverlieAdam  Henn M.D.   On: 09/10/2020 08:47   DG Abd Portable 1V  Result Date: 09/12/2020 CLINICAL DATA:  Hypoxia, respiratory distress, vomiting EXAM: PORTABLE ABDOMEN - 1 VIEW COMPARISON:  09/11/2020 FINDINGS: Gastrostomy overlies the stomach. Nonobstructive bowel gas pattern. Mild degenerative changes of the lumbar spine. IMPRESSION: Gastrostomy overlies the stomach. Electronically Signed   By: Charline BillsSriyesh  Krishnan M.D.   On: 09/12/2020 14:56    Labs:  CBC: Recent Labs    09/09/20 0505 09/10/20 0650 09/11/20 1317 09/12/20 0617  WBC 6.8 9.4 9.3 7.7  HGB 8.6* 8.4* 7.9* 7.7*  HCT 28.3* 27.7* 26.0* 25.0*  PLT 128* 96* 128* 130*    COAGS: Recent Labs    08/17/20 1018  INR 1.2    BMP: Recent Labs    09/09/20 0505 09/10/20 0650 09/11/20 1317 09/12/20 0617  NA 137 136 133* 133*  K 3.8 3.8 3.9 4.0  CL 99 99 95* 97*  CO2 27 26 25 23   GLUCOSE 122* 72 140* 140*  BUN 37* 58* 78* 89*  CALCIUM 8.4* 8.2* 8.5* 8.4*  CREATININE 1.45* 1.78* 2.15* 2.36*  GFRNONAA 38* 29* 23* 21*    LIVER FUNCTION TESTS: Recent Labs    08/17/20 1018 08/18/20 0554 09/09/20 0505 09/10/20 0650 09/11/20 1317 09/12/20 0617  BILITOT 0.4  --   --   --   --   --   AST 25  --   --   --   --   --   ALT 48*  --   --   --   --   --  ALKPHOS 113  --   --   --   --   --   PROT 5.9*  --   --   --   --   --   ALBUMIN 2.4*   < > 2.5* 2.1* 2.0* 1.8*   < > = values in this interval not displayed.    Assessment and Plan:  Right pneumothorax s/p chest tube exchange 09/12/20: Tube in stable position, chest x-ray shows improvement of  pneumothorax.  Further plans per [facility]mon[facility]. Please call IR with any questions/concerns.  Electronically Signed: Alwyn Ren, AGACNP-BC [phone] 09/13/2020, 12:43 PM   I spent a total of 15 Minutes at the the patient's bedside AND on the patient's hospital floor or unit, greater than 50% of which was counseling/coordinating care for right chest tube care.

## 2020-09-14 ENCOUNTER — Other Ambulatory Visit (HOSPITAL_COMMUNITY): Payer: Medicare Other

## 2020-09-14 LAB — CULTURE, BLOOD (ROUTINE X 2): Culture: NO GROWTH

## 2020-09-14 LAB — RENAL FUNCTION PANEL
Albumin: 1.8 g/dL — ABNORMAL LOW (ref 3.5–5.0)
Anion gap: 11 (ref 5–15)
BUN: 66 mg/dL — ABNORMAL HIGH (ref 8–23)
CO2: 25 mmol/L (ref 22–32)
Calcium: 8.4 mg/dL — ABNORMAL LOW (ref 8.9–10.3)
Chloride: 98 mmol/L (ref 98–111)
Creatinine, Ser: 1.78 mg/dL — ABNORMAL HIGH (ref 0.44–1.00)
GFR, Estimated: 29 mL/min — ABNORMAL LOW (ref >60)
Glucose, Bld: 138 mg/dL — ABNORMAL HIGH (ref 70–99)
Phosphorus: 3.9 mg/dL (ref 2.5–4.6)
Potassium: 3.5 mmol/L (ref 3.5–5.1)
Sodium: 134 mmol/L — ABNORMAL LOW (ref 135–145)

## 2020-09-14 LAB — CBC
HCT: 24.4 % — ABNORMAL LOW (ref 36.0–46.0)
Hemoglobin: 7.3 g/dL — ABNORMAL LOW (ref 12.0–15.0)
MCH: 29.3 pg (ref 26.0–34.0)
MCHC: 29.9 g/dL — ABNORMAL LOW (ref 30.0–36.0)
MCV: 98 fL (ref 80.0–100.0)
Platelets: 107 10*3/uL — ABNORMAL LOW (ref 150–400)
RBC: 2.49 MIL/uL — ABNORMAL LOW (ref 3.87–5.11)
RDW: 18.6 % — ABNORMAL HIGH (ref 11.5–15.5)
WBC: 6.8 10*3/uL (ref 4.0–10.5)
nRBC: 2.1 % — ABNORMAL HIGH (ref 0.0–0.2)

## 2020-09-14 LAB — VANCOMYCIN, TROUGH: Vancomycin Tr: 15 ug/mL (ref 15–20)

## 2020-09-14 MED FILL — Medication: Qty: 1 | Status: AC

## 2020-09-14 NOTE — Progress Notes (Signed)
[facility]  ROUNDING NOTE   Subjective:  Patient seen and evaluated at bedside. She remains critically ill. Still on 70% FiO2. Noted to be tachypneic.  Objective:  Vital signs in last 24 hours:  Temperature 97.6 pulse 92 respirations 27 blood pressure 102/54  Physical Exam: General:  Critically ill-appearing  Head:  Normocephalic, atraumatic.  Oral mucosa moist  Eyes:  Anicteric  Neck:  Tracheostomy in place  Lungs:   Coarse breath sounds bilateral, vent assisted, right-sided chest tube in place  Heart:  S1S2 no rubs  Abdomen:   Soft, nontender, bowel sounds present  Extremities:  2+ upper and lower extremity edema  Neurologic:  On the ventilator, not following commands  Skin:  No acute rashes  Access:  Right IJ temporary dialysis catheter    Basic Metabolic Panel: Recent Labs  Lab 09/09/20 0505 09/09/20 0505 09/10/20 0650 09/10/20 0650 09/11/20 1317 09/12/20 0617 09/14/20 0601  NA 137  --  136  --  133* 133* 134*  K 3.8  --  3.8  --  3.9 4.0 3.5  CL 99  --  99  --  95* 97* 98  CO2 27  --  26  --  25 23 25   GLUCOSE 122*  --  72  --  140* 140* 138*  BUN 37*  --  58*  --  78* 89* 66*  CREATININE 1.45*  --  1.78*  --  2.15* 2.36* 1.78*  CALCIUM 8.4*   < > 8.2*   < > 8.5* 8.4* 8.4*  MG  --   --  1.9  --  1.9  --   --   PHOS 3.7  --  4.9*  --  5.6* 6.1* 3.9   < > = values in this interval not displayed.    Liver Function Tests: Recent Labs  Lab 09/09/20 0505 09/10/20 0650 09/11/20 1317 09/12/20 0617 09/14/20 0601  ALBUMIN 2.5* 2.1* 2.0* 1.8* 1.8*   No results for input(s): LIPASE, AMYLASE in the last 168 hours. No results for input(s): AMMONIA in the last 168 hours.  CBC: Recent Labs  Lab 09/09/20 0505 09/10/20 0650 09/11/20 1317 09/12/20 0617  WBC 6.8 9.4 9.3 7.7  HGB 8.6* 8.4* 7.9* 7.7*  HCT 28.3* 27.7* 26.0* 25.0*  MCV 96.9 98.2 97.4 98.4  PLT 128* 96* 128* 130*    Cardiac Enzymes: No results for input(s): CKTOTAL, CKMB,  CKMBINDEX, TROPONINI in the last 168 hours.  BNP: Invalid input(s): POCBNP  CBG: No results for input(s): GLUCAP in the last 168 hours.  Microbiology: Results for orders placed or performed during the hospital encounter of 08/27/2020  Urine Culture     Status: Abnormal   Collection Time: 08/17/20  1:32 PM   Specimen: Urine, Random  Result Value Ref Range Status   Specimen Description URINE, RANDOM  Final   Special Requests   Final    NONE Performed at [facility], [address] [place], [place] [id number]    Culture >=100,000 COLONIES/mL PSEUDOMONAS AERUGINOSA (A)  Final   Report Status 08/19/2020 FINAL  Final   Organism ID, Bacteria PSEUDOMONAS AERUGINOSA (A)  Final      Susceptibility   Pseudomonas aeruginosa - MIC*    CEFTAZIDIME 16 INTERMEDIATE Intermediate     CIPROFLOXACIN <=0.25 SENSITIVE Sensitive     GENTAMICIN <=1 SENSITIVE Sensitive     IMIPENEM 1 SENSITIVE Sensitive     PIP/TAZO 16 SENSITIVE Sensitive     * >=100,000 COLONIES/mL PSEUDOMONAS  AERUGINOSA  Culture, blood (Routine X 2) w Reflex to ID Panel     Status: None   Collection Time: 08/17/20  1:54 PM   Specimen: BLOOD LEFT HAND  Result Value Ref Range Status   Specimen Description BLOOD LEFT HAND  Final   Special Requests   Final    BOTTLES DRAWN AEROBIC AND ANAEROBIC Blood Culture adequate volume   Culture   Final    NO GROWTH 5 DAYS Performed at [facility], [address], [place], [place] [address]    Report Status 08/22/2020 FINAL  Final  Culture, blood (Routine X 2) w Reflex to ID Panel     Status: None   Collection Time: 08/17/20  2:15 PM   Specimen: BLOOD LEFT ARM  Result Value Ref Range Status   Specimen Description BLOOD LEFT ARM  Final   Special Requests   Final    BOTTLES DRAWN AEROBIC ONLY Blood Culture results may not be optimal due to an inadequate volume of blood received in culture bottles   Culture   Final    NO GROWTH 5 DAYS Performed at [facility], [address], [place], [place] [address]    Report Status 08/22/2020 FINAL  Final  Culture, respiratory (non-expectorated)     Status: None   Collection Time: 08/17/20  6:14 PM   Specimen: Tracheal Aspirate; Respiratory  Result Value Ref Range Status   Specimen Description TRACHEAL ASPIRATE  Final   Special Requests NONE  Final   Gram Stain   Final    RARE WBC PRESENT, PREDOMINANTLY PMN FEW GRAM NEGATIVE RODS Performed at [facility], [address], [place], [place] [address]    Culture ABUNDANT PSEUDOMONAS AERUGINOSA  Final   Report Status 08/20/2020 FINAL  Final   Organism ID, Bacteria PSEUDOMONAS AERUGINOSA  Final      Susceptibility   Pseudomonas aeruginosa - MIC*    CEFTAZIDIME 4 SENSITIVE Sensitive     CIPROFLOXACIN <=0.25 SENSITIVE Sensitive     GENTAMICIN <=1 SENSITIVE Sensitive     IMIPENEM 1 SENSITIVE Sensitive     PIP/TAZO 8 SENSITIVE Sensitive     CEFEPIME 2 SENSITIVE Sensitive     * ABUNDANT PSEUDOMONAS AERUGINOSA  Culture, Urine     Status: Abnormal   Collection Time: 08/27/20 11:41 AM   Specimen: Urine, Random  Result Value Ref Range Status   Specimen Description URINE, RANDOM  Final   Special Requests   Final    NONE Performed at [facility], [address] [place], [place] [address]    Culture 70,000 COLONIES/mL YEAST (A)  Final   Report Status 08/28/2020 FINAL  Final  Culture, respiratory (non-expectorated)     Status: None   Collection Time: 08/27/20 12:10 PM   Specimen: Tracheal Aspirate; Respiratory  Result Value Ref Range Status   Specimen Description TRACHEAL ASPIRATE  Final   Special Requests NONE  Final   Gram Stain   Final    MODERATE WBC PRESENT, PREDOMINANTLY PMN NO ORGANISMS SEEN Performed at [facility], [address] [place], [place] [address]    Culture   Final    RARE METHICILLIN RESISTANT STAPHYLOCOCCUS AUREUS RARE PSEUDOMONAS AERUGINOSA    Report Status 08/30/2020 FINAL  Final   Organism ID, Bacteria METHICILLIN  RESISTANT STAPHYLOCOCCUS AUREUS  Final   Organism ID, Bacteria PSEUDOMONAS AERUGINOSA  Final      Susceptibility   Methicillin resistant staphylococcus aureus - MIC*  CIPROFLOXACIN >=8 RESISTANT Resistant     ERYTHROMYCIN >=8 RESISTANT Resistant     GENTAMICIN <=0.5 SENSITIVE Sensitive     OXACILLIN >=4 RESISTANT Resistant     TETRACYCLINE <=1 SENSITIVE Sensitive     VANCOMYCIN <=0.5 SENSITIVE Sensitive     TRIMETH/SULFA <=10 SENSITIVE Sensitive     CLINDAMYCIN <=0.25 SENSITIVE Sensitive     RIFAMPIN <=0.5 SENSITIVE Sensitive     Inducible Clindamycin NEGATIVE Sensitive     * RARE METHICILLIN RESISTANT STAPHYLOCOCCUS AUREUS   Pseudomonas aeruginosa - MIC*    CEFTAZIDIME 16 INTERMEDIATE Intermediate     CIPROFLOXACIN 1 SENSITIVE Sensitive     GENTAMICIN <=1 SENSITIVE Sensitive     IMIPENEM 1 SENSITIVE Sensitive     * RARE PSEUDOMONAS AERUGINOSA  SARS CORONAVIRUS 2 (TAT 6-24 HRS) Nasopharyngeal Nasopharyngeal Swab     Status: Abnormal   Collection Time: 08/31/20  8:20 AM   Specimen: Nasopharyngeal Swab  Result Value Ref Range Status   SARS Coronavirus 2 POSITIVE (A) NEGATIVE Final    Comment: RESULT CALLED TO, READ BACK BY AND VERIFIED WITH: [facility] RN 13:55 08/31/20 ([email or web address]) (NOTE) SARS-CoV-2 target nucleic acids are DETECTED.  The SARS-CoV-2 RNA is generally detectable in upper and lower respiratory specimens during the acute phase of infection. Positive results are indicative of the presence of SARS-CoV-2 RNA. Clinical correlation with patient history and other diagnostic information is  necessary to determine patient infection status. Positive results do not rule out bacterial infection or co-infection with other viruses.  The expected result is Negative.  Fact Sheet for Patients: HairSlick.no  Fact Sheet for Healthcare Providers: quierodirigir.com  This test is not yet approved or cleared by the [place] FDA and   has been authorized for detection and/or diagnosis of SARS-CoV-2 by FDA under an Emergency Use Authorization (EUA). This EUA will remain  in effect (meaning this test can be used) for  the duration of the COVID-19 declaration under Section 564(b)(1) of the Act, 21 U.S.C. section 360bbb-3(b)(1), unless the authorization is terminated or revoked sooner.   Performed at [facility], [address], [place], [place] [address]   Novel Coronavirus, NAA (Hosp order, Send-out to Ref Lab; TAT 18-24 hrs     Status: None   Collection Time: 08/31/20  2:00 PM   Specimen: Nasopharyngeal Swab; Respiratory  Result Value Ref Range Status   SARS-CoV-2, NAA NOT DETECTED NOT DETECTED Final    Comment: (NOTE) This nucleic acid amplification test was developed and its performance characteristics determined by [employer]. Nucleic acid amplification tests include RT-PCR and TMA. This test has not been FDA cleared or approved. This test has been authorized by FDA under an Emergency Use Authorization (EUA). This test is only authorized for the duration of time the declaration that circumstances exist justifying the authorization of the emergency use of in vitro diagnostic tests for detection of SARS-CoV-2 virus and/or diagnosis of COVID-19 infection under section 564(b)(1) of the Act, 21 U.S.C. 801KPV-3(Z) (1), unless the authorization is terminated or revoked sooner. When diagnostic testing is negative, the possibility of a false negative result should be considered in the context of a patient's recent exposures and the presence of clinical signs and symptoms consistent with COVID-19. An individual without symptoms of COVID- 19 and who is not shedding SARS-CoV-2  virus would expect to have a negative (not detected) result in this assay. Performed At: [facility] [address] [place], [place] [id number] Jolene Schimke MD JQ:[id number]    Coronavirus  Source NASOPHARYNGEAL   Final    Comment: Performed at [facility], [address], [place], [place] [address]  Culture, blood (routine x 2)     Status: None   Collection Time: 09/09/20 11:29 AM   Specimen: BLOOD RIGHT HAND  Result Value Ref Range Status   Specimen Description BLOOD RIGHT HAND  Final   Special Requests   Final    BOTTLES DRAWN AEROBIC ONLY Blood Culture results may not be optimal due to an inadequate volume of blood received in culture bottles   Culture   Final    NO GROWTH 5 DAYS Performed at [facility], [address], [place], [place] [address]    Report Status 09/14/2020 FINAL  Final  Culture, blood (routine x 2)     Status: Abnormal   Collection Time: 09/09/20 11:34 AM   Specimen: BLOOD LEFT HAND  Result Value Ref Range Status   Specimen Description BLOOD LEFT HAND  Final   Special Requests   Final    BOTTLES DRAWN AEROBIC ONLY Blood Culture results may not be optimal due to an inadequate volume of blood received in culture bottles   Culture  Setup Time   Final    GRAM POSITIVE COCCI IN CLUSTERS AEROBIC BOTTLE ONLY CRITICAL RESULT CALLED TO, READ BACK BY AND VERIFIED WITH: RN S HAN [id number] AT [id number] CM    Culture (A)  Final    STAPHYLOCOCCUS HAEMOLYTICUS THE SIGNIFICANCE OF ISOLATING THIS ORGANISM FROM A SINGLE VENIPUNCTURE CANNOT BE PREDICTED WITHOUT FURTHER CLINICAL AND CULTURE CORRELATION. SUSCEPTIBILITIES AVAILABLE ONLY ON REQUEST. Performed at [facility], [address], [place], [place] [address]    Report Status 09/12/2020 FINAL  Final  Blood Culture ID Panel (Reflexed)     Status: Abnormal   Collection Time: 09/09/20 11:34 AM  Result Value Ref Range Status   Enterococcus faecalis NOT DETECTED NOT DETECTED Final   Enterococcus Faecium NOT DETECTED NOT DETECTED Final   Listeria monocytogenes NOT DETECTED NOT DETECTED Final   Staphylococcus species DETECTED (A) NOT DETECTED Final    Comment: CRITICAL RESULT CALLED TO, READ BACK BY AND VERIFIED  WITH: RN S HAN [id number] AT 1424 BY CM    Staphylococcus aureus (BCID) NOT DETECTED NOT DETECTED Final   Staphylococcus epidermidis NOT DETECTED NOT DETECTED Final   Staphylococcus lugdunensis NOT DETECTED NOT DETECTED Final   Streptococcus species NOT DETECTED NOT DETECTED Final   Streptococcus agalactiae NOT DETECTED NOT DETECTED Final   Streptococcus pneumoniae NOT DETECTED NOT DETECTED Final   Streptococcus pyogenes NOT DETECTED NOT DETECTED Final   A.calcoaceticus-baumannii NOT DETECTED NOT DETECTED Final   Bacteroides fragilis NOT DETECTED NOT DETECTED Final   Enterobacterales NOT DETECTED NOT DETECTED Final   Enterobacter cloacae complex NOT DETECTED NOT DETECTED Final   Escherichia coli NOT DETECTED NOT DETECTED Final   Klebsiella aerogenes NOT DETECTED NOT DETECTED Final   Klebsiella oxytoca NOT DETECTED NOT DETECTED Final   Klebsiella pneumoniae NOT DETECTED NOT DETECTED Final   Proteus species NOT DETECTED NOT DETECTED Final   Salmonella species NOT DETECTED NOT DETECTED Final   Serratia marcescens NOT DETECTED NOT DETECTED Final   Haemophilus influenzae NOT DETECTED NOT DETECTED Final   Neisseria meningitidis NOT DETECTED NOT DETECTED Final   Pseudomonas aeruginosa NOT DETECTED NOT DETECTED Final   Stenotrophomonas maltophilia NOT DETECTED NOT DETECTED Final   Candida albicans NOT DETECTED NOT DETECTED Final   Candida auris NOT DETECTED NOT DETECTED Final   Candida  glabrata NOT DETECTED NOT DETECTED Final   Candida krusei NOT DETECTED NOT DETECTED Final   Candida parapsilosis NOT DETECTED NOT DETECTED Final   Candida tropicalis NOT DETECTED NOT DETECTED Final   Cryptococcus neoformans/gattii NOT DETECTED NOT DETECTED Final    Comment: Performed at [facility], [address], [place], [place] [address]  Culture, respiratory (non-expectorated)     Status: None   Collection Time: 09/09/20  6:05 PM   Specimen: Tracheal Aspirate; Respiratory  Result Value Ref Range  Status   Specimen Description TRACHEAL ASPIRATE  Final   Special Requests NONE  Final   Gram Stain   Final    MODERATE WBC PRESENT, PREDOMINANTLY PMN ABUNDANT GRAM NEGATIVE RODS Performed at [facility], [address] [place], [place] [address]    Culture MODERATE PSEUDOMONAS AERUGINOSA  Final   Report Status 09/12/2020 FINAL  Final   Organism ID, Bacteria PSEUDOMONAS AERUGINOSA  Final      Susceptibility   Pseudomonas aeruginosa - MIC*    CEFTAZIDIME 8 SENSITIVE Sensitive     CIPROFLOXACIN 1 SENSITIVE Sensitive     GENTAMICIN <=1 SENSITIVE Sensitive     IMIPENEM 1 SENSITIVE Sensitive     * MODERATE PSEUDOMONAS AERUGINOSA  Culture, Urine     Status: Abnormal   Collection Time: 09/10/20  3:55 PM   Specimen: Urine, Random  Result Value Ref Range Status   Specimen Description URINE, RANDOM  Final   Special Requests   Final    NONE Performed at [facility], [address] [place], [place] [address]    Culture MULTIPLE SPECIES PRESENT, SUGGEST RECOLLECTION (A)  Final   Report Status 09/11/2020 FINAL  Final  Culture, blood (routine x 2)     Status: None (Preliminary result)   Collection Time: 09/11/20  6:12 PM   Specimen: BLOOD  Result Value Ref Range Status   Specimen Description BLOOD RIGHT ANTECUBITAL  Final   Special Requests   Final    BOTTLES DRAWN AEROBIC AND ANAEROBIC Blood Culture adequate volume   Culture   Final    NO GROWTH 3 DAYS Performed at [facility], [address], [place], [place] [address]    Report Status PENDING  Incomplete  Culture, blood (routine x 2)     Status: None (Preliminary result)   Collection Time: 09/11/20  6:17 PM   Specimen: BLOOD  Result Value Ref Range Status   Specimen Description BLOOD LEFT ANTECUBITAL  Final   Special Requests   Final    BOTTLES DRAWN AEROBIC AND ANAEROBIC Blood Culture adequate volume   Culture   Final    NO GROWTH 3 DAYS Performed at [facility], [address], [place], [place]  [address]    Report Status PENDING  Incomplete    Coagulation Studies: No results for input(s): LABPROT, INR in the last 72 hours.  Urinalysis: No results for input(s): COLORURINE, LABSPEC, PHURINE, GLUCOSEU, HGBUR, BILIRUBINUR, KETONESUR, PROTEINUR, UROBILINOGEN, NITRITE, LEUKOCYTESUR in the last 72 hours.  Invalid input(s): APPERANCEUR    Imaging: IR Catheter Tube Change  Result Date: 09/13/2020 CLINICAL DATA:  History of COVID pneumonia complicated by development of pneumothorax, post bedside chest tube placement on 09/09/2020. Given chest tube retraction and worsening subcutaneous emphysema, request made for fluoroscopic guided chest tube exchange and repositioning. EXAM: IR CATHETER TUBE CHANGE COMPARISON:  Chest radiograph-09/09/2020; 09/10/2020; 09/11/2020; 09/12/2020 CONTRAST:  None MEDICATIONS: None. ANESTHESIA/SEDATION: None FLUOROSCOPY TIME:  18 seconds (5 mGy)  TECHNIQUE: Patient was positioned supine on the fluoroscopy table. The external portion of the existing percutaneous drainage catheter as well as the surrounding skin was prepped and draped in usual sterile fashion. A preprocedural spot fluoroscopic image was obtained of the basilar aspect of the right hemithorax and the existing percutaneous drainage catheter. The external portion of the percutaneous drainage catheter was cut and cannulated with a short Amplatz wire. Under intermittent fluoroscopic guidance, the existing percutaneous drainage catheter was exchanged for a new 14 French percutaneous drainage catheter with end coiled and locked within the more cranial aspect of the right pleural space. The percutaneous drainage catheter was connected to a pleura vac device and secured in place with 2 interrupted sutures and a StatLock device. A Vaseline gauze dressing was applied. The patient tolerated the procedure well without immediate postprocedural complication. FINDINGS: Preprocedural spot fluoroscopic image demonstrates right  basilar chest tube overlying the right mid/lower hemithorax with large amount of subcutaneous emphysema and small to moderate-sized right-sided pneumothorax. Following fluoroscopic guided exchange, the new right-sided chest tube is better positioned within the more cranial aspect of the right pleural space with reduction in size of right-sided pneumothorax. IMPRESSION: Successful fluoroscopic guided exchange and repositioning of 14 French right-sided chest tube. PLAN: - Further management of the right-sided chest tube as per the clinical service Electronically Signed   By: Simonne Come M.D.   On: 09/13/2020 12:03   DG Chest 1V REPEAT Same Day  Result Date: 09/12/2020 CLINICAL DATA:  Hypoxia, respiratory distress, vomiting EXAM: CHEST - 1 VIEW SAME DAY COMPARISON:  09/12/2020 at 0555 hours FINDINGS: Small right apical pneumothorax, unchanged. Indwelling right pigtail chest tube/drain at the right lung base. Extensive subcutaneous emphysema along the right lateral chest wall, left lateral chest wall, and bilateral supraclavicular regions, new. Underlying bilateral pulmonary opacities, obscured by subcutaneous emphysema. The heart is mildly enlarged.  Suspected pneumomediastinum. Tracheostomy in satisfactory position. Right subclavian and right IJ venous catheters terminating in the upper right atrium. IMPRESSION: Small right apical pneumothorax, unchanged. Indwelling right pigtail chest tube/drain at the right lung base. Extensive subcutaneous emphysema, new. Electronically Signed   By: Charline Bills M.D.   On: 09/12/2020 14:56   DG CHEST PORT 1 VIEW  Result Date: 09/14/2020 CLINICAL DATA:  Pneumothorax with chest tube EXAM: PORTABLE CHEST 1 VIEW COMPARISON:  Yesterday FINDINGS: Tracheostomy tube in place. Stable right chest tube positioning. Right-sided central line tips at the right atrium. Extensive chest wall emphysema which is stable to diminished. Small right pneumothorax seen at the apex and base,  unchanged. Cardiomegaly and confluent pulmonary opacity. IMPRESSION: 1. Unchanged small right pneumothorax. 2. Stable to diminished extensive chest wall emphysema. 3. Stable cardiomegaly and pulmonary infiltrates. Electronically Signed   By: Marnee Spring M.D.   On: 09/14/2020 06:16   DG CHEST PORT 1 VIEW  Result Date: 09/13/2020 CLINICAL DATA:  Pneumonia.  Pneumothorax.  Chest tube.  COVID. EXAM: PORTABLE CHEST 1 VIEW COMPARISON:  09/12/2020. FINDINGS: Tracheostomy tube, right central lines, right chest tube in stable position. Interim improvement of right-sided pneumothorax with mild right apical residual. Extensive bilateral chest wall subcutaneous emphysema again noted. Diffuse bilateral interstitial infiltrates again noted without interim change. Heart size stable. IMPRESSION: 1. Lines and tubes including right chest tube in stable position. Interim improvement of right-sided pneumothorax with mild right apical residual. Extensive bilateral chest wall subcutaneous emphysema again noted. 2. Diffuse bilateral interstitial infiltrates again noted without interim change. Electronically Signed   By: Maisie Fus  Register   On:  09/13/2020 06:09   DG Abd Portable 1V  Result Date: 09/12/2020 CLINICAL DATA:  Hypoxia, respiratory distress, vomiting EXAM: PORTABLE ABDOMEN - 1 VIEW COMPARISON:  09/11/2020 FINDINGS: Gastrostomy overlies the stomach. Nonobstructive bowel gas pattern. Mild degenerative changes of the lumbar spine. IMPRESSION: Gastrostomy overlies the stomach. Electronically Signed   By: Charline Bills M.D.   On: 09/12/2020 14:56     Medications:       Assessment/ Plan:  74 y.o. female with a PMHx of recent acute respiratory failure secondary to COVID-19 pneumonia, hypertension, hyperlipidemia, diabetes mellitus type 2, depression, anxiety, obesity, atrial fibrillation, history of MRSA bacteremia and fungal pneumonia, recent acute kidney injury, who was admitted to [facility] on 08/22/2020 for  ongoing treatment of acute respiratory failure.   1.  Acute kidney injury.    Patient remains dialysis dependent at this time.  We will plan for hemodialysis treatment today.  2.  Hyponatremia.  Serum sodium up to 134.  Continue to monitor.  3.  Acute respiratory failure/tension pneumothorax.  Chest tube remains in place.  Still requiring significant ventilatory support is FiO2 currently 70 with a PEEP of 5.  4.  Generalized edema/volume overload.  Continue ultrafiltration with dialysis sessions.     LOS: 0 Aimee Peck 11/3/20217:53 AM

## 2020-09-15 ENCOUNTER — Other Ambulatory Visit (HOSPITAL_COMMUNITY): Payer: Medicare Other

## 2020-09-15 DIAGNOSIS — I4891 Unspecified atrial fibrillation: Secondary | ICD-10-CM | POA: Diagnosis not present

## 2020-09-15 DIAGNOSIS — J9621 Acute and chronic respiratory failure with hypoxia: Secondary | ICD-10-CM | POA: Diagnosis not present

## 2020-09-15 DIAGNOSIS — U071 COVID-19: Secondary | ICD-10-CM | POA: Diagnosis not present

## 2020-09-15 DIAGNOSIS — N170 Acute kidney failure with tubular necrosis: Secondary | ICD-10-CM | POA: Diagnosis not present

## 2020-09-15 LAB — CBC
HCT: 25.6 % — ABNORMAL LOW (ref 36.0–46.0)
Hemoglobin: 7.6 g/dL — ABNORMAL LOW (ref 12.0–15.0)
MCH: 29.3 pg (ref 26.0–34.0)
MCHC: 29.7 g/dL — ABNORMAL LOW (ref 30.0–36.0)
MCV: 98.8 fL (ref 80.0–100.0)
Platelets: 102 10*3/uL — ABNORMAL LOW (ref 150–400)
RBC: 2.59 MIL/uL — ABNORMAL LOW (ref 3.87–5.11)
RDW: 19.2 % — ABNORMAL HIGH (ref 11.5–15.5)
WBC: 7.1 10*3/uL (ref 4.0–10.5)
nRBC: 3.7 % — ABNORMAL HIGH (ref 0.0–0.2)

## 2020-09-15 NOTE — Progress Notes (Signed)
Pulmonary Critical Care Medicine [facility] [place]   PULMONARY CRITICAL CARE SERVICE  PROGRESS NOTE  Date of Service: 09/15/2020  [name]  DGL:[id number]  DOB: 02-07-1946   DOA: 08/15/2020  Referring Physician: Carron Curie, MD  HPI: Aimee Peck is a 74 y.o. female seen for follow up of Acute on Chronic Respiratory Failure.  Patient now is on assist control mode has been on 28% FiO2 with a PEEP of 7  Medications: Reviewed on Rounds  Physical Exam:  Vitals: Temperature is 97.3 pulse 115 respiratory 29 blood pressure is 96/50 saturations 93%  Ventilator Settings on assist control FiO2 28% tidal volume 460 with a PEEP of 7  . General: Comfortable at this time . Eyes: Grossly normal lids, irises & conjunctiva . ENT: grossly tongue is normal . Neck: no obvious mass . Cardiovascular: S1 S2 normal no gallop . Respiratory: No rhonchi no rales noted at this time . Abdomen: soft . Skin: no rash seen on limited exam . Musculoskeletal: not rigid . Psychiatric:unable to assess . Neurologic: no seizure no involuntary movements         Lab Data:   Basic Metabolic Panel: Recent Labs  Lab 09/09/20 0505 09/10/20 0650 09/11/20 1317 09/12/20 0617 09/14/20 0601  NA 137 136 133* 133* 134*  K 3.8 3.8 3.9 4.0 3.5  CL 99 99 95* 97* 98  CO2 27 26 25 23 25   GLUCOSE 122* 72 140* 140* 138*  BUN 37* 58* 78* 89* 66*  CREATININE 1.45* 1.78* 2.15* 2.36* 1.78*  CALCIUM 8.4* 8.2* 8.5* 8.4* 8.4*  MG  --  1.9 1.9  --   --   PHOS 3.7 4.9* 5.6* 6.1* 3.9    ABG: Recent Labs  Lab 09/09/20 0824 09/10/20 0510 09/11/20 1245  PHART 7.360 7.321* 7.299*  PCO2ART 50.5* 52.6* 51.5*  PO2ART 49.6* 76.4* 58.6*  HCO3 27.6 26.4 24.5  O2SAT 85.3 95.8 89.7    Liver Function Tests: Recent Labs  Lab 09/09/20 0505 09/10/20 0650 09/11/20 1317 09/12/20 0617 09/14/20 0601  ALBUMIN 2.5* 2.1* 2.0* 1.8* 1.8*   No results for input(s): LIPASE, AMYLASE in the last 168  hours. No results for input(s): AMMONIA in the last 168 hours.  CBC: Recent Labs  Lab 09/10/20 0650 09/11/20 1317 09/12/20 0617 09/14/20 0601 09/15/20 0420  WBC 9.4 9.3 7.7 6.8 7.1  HGB 8.4* 7.9* 7.7* 7.3* 7.6*  HCT 27.7* 26.0* 25.0* 24.4* 25.6*  MCV 98.2 97.4 98.4 98.0 98.8  PLT 96* 128* 130* 107* 102*    Cardiac Enzymes: No results for input(s): CKTOTAL, CKMB, CKMBINDEX, TROPONINI in the last 168 hours.  BNP (last 3 results) No results for input(s): BNP in the last 8760 hours.  ProBNP (last 3 results) No results for input(s): PROBNP in the last 8760 hours.  Radiological Exams: DG Chest Port 1 View  Result Date: 09/15/2020 CLINICAL DATA:  Pneumonia.  Pneumothorax.  Chest tube. EXAM: PORTABLE CHEST 1 VIEW COMPARISON:  09/14/2020. FINDINGS: Tracheostomy tube right central lines, right chest tube in stable position. Small right pneumothorax again noted. Stable cardiomegaly. Severe diffuse bilateral pulmonary infiltrates/edema again noted. No interim change. No pleural effusion. Extensive chest wall subcutaneous emphysema again noted. IMPRESSION: 1. Lines and tubes including right chest tube in stable position. Small pneumothorax again noted. Extensive chest wall subcutaneous emphysema again noted. 2. Severe diffuse bilateral pulmonary infiltrates/edema again noted. No interim change. Electronically Signed   By: 13/01/2020  Register   On: 09/15/2020 05:41   DG CHEST  PORT 1 VIEW  Result Date: 09/14/2020 CLINICAL DATA:  Pneumothorax with chest tube EXAM: PORTABLE CHEST 1 VIEW COMPARISON:  Yesterday FINDINGS: Tracheostomy tube in place. Stable right chest tube positioning. Right-sided central line tips at the right atrium. Extensive chest wall emphysema which is stable to diminished. Small right pneumothorax seen at the apex and base, unchanged. Cardiomegaly and confluent pulmonary opacity. IMPRESSION: 1. Unchanged small right pneumothorax. 2. Stable to diminished extensive chest wall  emphysema. 3. Stable cardiomegaly and pulmonary infiltrates. Electronically Signed   By: Marnee Spring M.D.   On: 09/14/2020 06:16    Assessment/Plan Active Problems:   Acute on chronic respiratory failure with hypoxia (HCC)   COVID-19 virus infection   Atrial fibrillation with RVR (HCC)   Acute renal failure due to tubular necrosis (HCC)   Metabolic encephalopathy   1. Acute on chronic respiratory failure hypoxia we will continue with assist control titrate oxygen as tolerated respiratory therapy will continue to check the RSB I mechanics.  Patient remains with small right-sided pneumothorax and subcutaneous emphysema 2. COVID-19 virus infection in recovery phase we will continue supportive care 3. Atrial fibrillation rate controlled cardiology following 4. Acute renal failure being seen by nephrology 5. Metabolic encephalopathy no change supportive care   I have personally seen and evaluated the patient, evaluated laboratory and imaging results, formulated the assessment and plan and placed orders. The Patient requires high complexity decision making with multiple systems involvement.  Rounds were done with the Respiratory Therapy Director and Staff therapists and discussed with nursing staff also.  Yevonne Pax, MD [facility] Pulmonary Critical Care Medicine Sleep Medicine

## 2020-09-16 ENCOUNTER — Other Ambulatory Visit (HOSPITAL_COMMUNITY): Payer: Medicare Other

## 2020-09-16 DIAGNOSIS — I4891 Unspecified atrial fibrillation: Secondary | ICD-10-CM | POA: Diagnosis not present

## 2020-09-16 DIAGNOSIS — N170 Acute kidney failure with tubular necrosis: Secondary | ICD-10-CM | POA: Diagnosis not present

## 2020-09-16 DIAGNOSIS — U071 COVID-19: Secondary | ICD-10-CM | POA: Diagnosis not present

## 2020-09-16 DIAGNOSIS — J9621 Acute and chronic respiratory failure with hypoxia: Secondary | ICD-10-CM | POA: Diagnosis not present

## 2020-09-16 LAB — CBC
HCT: 25.4 % — ABNORMAL LOW (ref 36.0–46.0)
Hemoglobin: 7.5 g/dL — ABNORMAL LOW (ref 12.0–15.0)
MCH: 29.4 pg (ref 26.0–34.0)
MCHC: 29.5 g/dL — ABNORMAL LOW (ref 30.0–36.0)
MCV: 99.6 fL (ref 80.0–100.0)
Platelets: 120 10*3/uL — ABNORMAL LOW (ref 150–400)
RBC: 2.55 MIL/uL — ABNORMAL LOW (ref 3.87–5.11)
RDW: 19.3 % — ABNORMAL HIGH (ref 11.5–15.5)
WBC: 11.7 10*3/uL — ABNORMAL HIGH (ref 4.0–10.5)
nRBC: 2.7 % — ABNORMAL HIGH (ref 0.0–0.2)

## 2020-09-16 LAB — CULTURE, BLOOD (ROUTINE X 2)
Culture: NO GROWTH
Culture: NO GROWTH
Special Requests: ADEQUATE
Special Requests: ADEQUATE

## 2020-09-16 LAB — RENAL FUNCTION PANEL
Albumin: 1.9 g/dL — ABNORMAL LOW (ref 3.5–5.0)
Anion gap: 11 (ref 5–15)
BUN: 57 mg/dL — ABNORMAL HIGH (ref 8–23)
CO2: 26 mmol/L (ref 22–32)
Calcium: 8.7 mg/dL — ABNORMAL LOW (ref 8.9–10.3)
Chloride: 96 mmol/L — ABNORMAL LOW (ref 98–111)
Creatinine, Ser: 1.6 mg/dL — ABNORMAL HIGH (ref 0.44–1.00)
GFR, Estimated: 33 mL/min — ABNORMAL LOW (ref >60)
Glucose, Bld: 112 mg/dL — ABNORMAL HIGH (ref 70–99)
Phosphorus: 3.5 mg/dL (ref 2.5–4.6)
Potassium: 4 mmol/L (ref 3.5–5.1)
Sodium: 133 mmol/L — ABNORMAL LOW (ref 135–145)

## 2020-09-16 LAB — VANCOMYCIN, TROUGH: Vancomycin Tr: 23 ug/mL (ref 15–20)

## 2020-09-16 NOTE — Progress Notes (Signed)
PROGRESS NOTE    Aimee Peck  WUJ:[id number] DOB: [birth year] DOA: 2020/08/25  Brief Narrative:  Aimee Peck is an 74 y.o. female with medical history significant of hypertension, hyperlipidemia, diabetes, anxiety, depression, chronic pain, obesity, atrial fibrillation who presented to the acute facility because of body aches, shortness of breath.  Patient found to be positive for Covid.  She received treatment with remdesivir, Decadron, Ticlid.  Patient also apparently had MRSA pneumonia and was treated with IV vancomycin and Zosyn.  However, despite care she continued to decline with worsening respiratory failure.  Patient was intubated.  She was unable to be weaned from the ventilator due to tachypnea, tachycardia and hypoxemia.  Goals of care were discussed with the family who wished to continue with full code and all treatment including tracheostomy and PEG tube. Hospital course was complicated by septic shock requiring pressors.  She also had atrial fibrillation requiring amiodarone, digoxin, she was treated with therapeutic anticoagulation with Lovenox.  She also had mild acute renal failure.  Fungemia with Candida in the blood and MRSA in the sputum treated with antimicrobials.  Due to her complex medical problems she was transferred to [facility].  She previously had UTI with Pseudomonas on 08/17/2020 urine cultures which was treated. Tracheal aspirate cultures from 08/27/2020 showed MRSA, Pseudomonas aeruginosa.  Blood cultures on 08/27/2020 did not show any growth.  Urine cultures from 08/27/2020 showed yeast.  She received treatment with IV vancomycin, ciprofloxacin. 09/09/2020: Patient became acutely short of breath.  She had a chest x-ray that showed large right pneumothorax.  She had emergent needle decompression and chest tube placement. 09/16/2020: Patient remains on the ventilator, 80% FiO2 and 7 of PEEP.  Assessment & Plan:  Acute on chronic respiratory failure  with hypoxia, ventilator dependent  COVID-19 virus infection Pneumonia with MRSA, Pseudomonas Right tension pneumothorax Systemic inflammatory response syndrome Leukocytosis UTI with yeast   Atrial fibrillation with RVR   Acute renal failure due to tubular necrosis  Fluid overload with anasarca  Encephalopathy Thrombocytopenia Dysphagia/protein calorie malnutrition Diabetes mellitus  Acute on chronic respiratory failure with hypoxemia, ventilator dependent: Patient initially presented with COVID-19 infection.  She was treated with remdesivir and Decadron at the outside facility. After that had secondary bacterial pneumonia with MRSA and Pseudomonas.  She received treatment with IV vancomycin, ciprofloxacin.  -She improved.  However, last week she had acute shortness of breath with chest imaging that showed right-sided tension pneumothorax.  She is status post urgent decompression and chest tube placement.  After that she was started on empiric IV vancomycin, cefepime.  Vancomycin held due to high trough.  Currently on cefepime which should be ending in the next day or 2.  Unfortunately she is still on the vent with 80% FiO2, 7 of PEEP. She also has dysphagia and at risk for aspiration. Pulmonary following.  COVID-19 infection: As mentioned above she was treated with remdesivir and Decadron at the outside facility.  Here treated with hydroxyurea.  Further management per the primary team.  Pneumonia: Respiratory culture showed Pseudomonas, MRSA.    Received treatment with IV vancomycin, ciprofloxacin.  After that restarted on empiric antibiotics again as mentioned above. If not improving consider repeating chest imaging preferably chest CT if feasible and sending repeat respiratory cultures if needed.  Systemic inflammatory response syndrome: Likely secondary to the pneumonia.  She had septic shock with candidemia and bacteremia at the outside facility and received treatment with multiple  antibiotics.  She had blood cultures from  here on 09/09/2020 which showed Staphylococcus hemolyticus.  Likely contaminant.  However, given the severity of her illness she was started on treatment with IV vancomycin, empiric IV cefepime.  However, Vanc trough was high therefore vancomycin had to be held.  Repeat blood cultures from 09/11/2020 did not show any growth.  She is high risk for recurrent sepsis.  If she starts having worsening fevers would recommend to send for repeat pancultures.  Leukocytosis: Antibiotics as mentioned above.  Continue to monitor.  UTI: Urine culture showed yeast.  She also had candidemia at the outside facility. Treated with fluconazole.   Acute renal failure: In the setting of sepsis with shock, ATN. Patient on dialysis.  Please monitor BUN/creatinine closely while on antibiotics.  Avoid nephrotoxic medications.  Nephrology following.  Atrial fibrillation: Continue medications per primary team.  Volume overload/anasarca: Patient on dialysis per nephrology.  Thrombocytopenia: Patient noted to have low platelets in the setting of infection, systemic inflammatory response syndrome.  Antibiotics as mentioned above.  Please monitor closely.  Encephalopathy: Continue supportive management per the primary team.  Dysphagia/protein calorie malnutrition: Due to the dysphagia she is high risk for aspiration and aspiration pneumonia.  Management per the primary team.  Diabetes mellitus: Management per primary team.  Unfortunately due to her complex medical problems she is high risk for worsening and decompensation.   Overall poor prognosis.  Plan of care discussed with the primary team and pharmacy.  Subjective: She remains on the ventilator, 80% FiO2, PEEP of 7.  Continues to remain unresponsive.  Objective: Vitals: Temperature 97.2, pulse 113, respiratory 24, blood pressure 91/54, pulse oximetry 94%  Examination: Constitutional: Remains encephalopathic, not  following commands Eyes: PERLA  ENMT: external ears and nose appear normal, Lips appear normal, moist oral mucosa Neck: Trach in place CVS: S1-S2 Respiratory:  Right-sided chest tube, rhonchi Abdomen: soft, nondistended, positive bowel sounds  Musculoskeletal: Edema with anasarca Neuro: Encephalopathic, unable to do neuro exam at this time Psych: Unable to assess  Skin: no rashes    Data Reviewed: I have personally reviewed following labs and imaging studies  CBC: Recent Labs  Lab 09/11/20 1317 09/12/20 0617 09/14/20 0601 09/15/20 0420 09/16/20 0457  WBC 9.3 7.7 6.8 7.1 11.7*  HGB 7.9* 7.7* 7.3* 7.6* 7.5*  HCT 26.0* 25.0* 24.4* 25.6* 25.4*  MCV 97.4 98.4 98.0 98.8 99.6  PLT 128* 130* 107* 102* 120*    Basic Metabolic Panel: Recent Labs  Lab 09/10/20 0650 09/11/20 1317 09/12/20 0617 09/14/20 0601 09/16/20 0457  NA 136 133* 133* 134* 133*  K 3.8 3.9 4.0 3.5 4.0  CL 99 95* 97* 98 96*  CO2 26 25 23 25 26   GLUCOSE 72 140* 140* 138* 112*  BUN 58* 78* 89* 66* 57*  CREATININE 1.78* 2.15* 2.36* 1.78* 1.60*  CALCIUM 8.2* 8.5* 8.4* 8.4* 8.7*  MG 1.9 1.9  --   --   --   PHOS 4.9* 5.6* 6.1* 3.9 3.5    GFR: CrCl cannot be calculated (Unknown ideal weight.).  Liver Function Tests: Recent Labs  Lab 09/10/20 0650 09/11/20 1317 09/12/20 0617 09/14/20 0601 09/16/20 0457  ALBUMIN 2.1* 2.0* 1.8* 1.8* 1.9*    CBG: No results for input(s): GLUCAP in the last 168 hours.   Recent Results (from the past 240 hour(s))  Culture, blood (routine x 2)     Status: None   Collection Time: 09/09/20 11:29 AM   Specimen: BLOOD RIGHT HAND  Result Value Ref Range Status   Specimen Description BLOOD  RIGHT HAND  Final   Special Requests   Final    BOTTLES DRAWN AEROBIC ONLY Blood Culture results may not be optimal due to an inadequate volume of blood received in culture bottles   Culture   Final    NO GROWTH 5 DAYS Performed at [facility], [address]  [place], [place] [address]    Report Status 09/14/2020 FINAL  Final  Culture, blood (routine x 2)     Status: Abnormal   Collection Time: 09/09/20 11:34 AM   Specimen: BLOOD LEFT HAND  Result Value Ref Range Status   Specimen Description BLOOD LEFT HAND  Final   Special Requests   Final    BOTTLES DRAWN AEROBIC ONLY Blood Culture results may not be optimal due to an inadequate volume of blood received in culture bottles   Culture  Setup Time   Final    GRAM POSITIVE COCCI IN CLUSTERS AEROBIC BOTTLE ONLY CRITICAL RESULT CALLED TO, READ BACK BY AND VERIFIED WITH: RN S HAN [id number] AT [id number] CM    Culture (A)  Final    STAPHYLOCOCCUS HAEMOLYTICUS THE SIGNIFICANCE OF ISOLATING THIS ORGANISM FROM A SINGLE VENIPUNCTURE CANNOT BE PREDICTED WITHOUT FURTHER CLINICAL AND CULTURE CORRELATION. SUSCEPTIBILITIES AVAILABLE ONLY ON REQUEST. Performed at [facility], [address] [place], [place] [address]    Report Status 09/12/2020 FINAL  Final  Blood Culture ID Panel (Reflexed)     Status: Abnormal   Collection Time: 09/09/20 11:34 AM  Result Value Ref Range Status   Enterococcus faecalis NOT DETECTED NOT DETECTED Final   Enterococcus Faecium NOT DETECTED NOT DETECTED Final   Listeria monocytogenes NOT DETECTED NOT DETECTED Final   Staphylococcus species DETECTED (A) NOT DETECTED Final    Comment: CRITICAL RESULT CALLED TO, READ BACK BY AND VERIFIED WITH: RN S HAN [id number] AT 1424 BY CM    Staphylococcus aureus (BCID) NOT DETECTED NOT DETECTED Final   Staphylococcus epidermidis NOT DETECTED NOT DETECTED Final   Staphylococcus lugdunensis NOT DETECTED NOT DETECTED Final   Streptococcus species NOT DETECTED NOT DETECTED Final   Streptococcus agalactiae NOT DETECTED NOT DETECTED Final   Streptococcus pneumoniae NOT DETECTED NOT DETECTED Final   Streptococcus pyogenes NOT DETECTED NOT DETECTED Final   A.calcoaceticus-baumannii NOT DETECTED NOT DETECTED Final   Bacteroides fragilis NOT DETECTED  NOT DETECTED Final   Enterobacterales NOT DETECTED NOT DETECTED Final   Enterobacter cloacae complex NOT DETECTED NOT DETECTED Final   Escherichia coli NOT DETECTED NOT DETECTED Final   Klebsiella aerogenes NOT DETECTED NOT DETECTED Final   Klebsiella oxytoca NOT DETECTED NOT DETECTED Final   Klebsiella pneumoniae NOT DETECTED NOT DETECTED Final   Proteus species NOT DETECTED NOT DETECTED Final   Salmonella species NOT DETECTED NOT DETECTED Final   Serratia marcescens NOT DETECTED NOT DETECTED Final   Haemophilus influenzae NOT DETECTED NOT DETECTED Final   Neisseria meningitidis NOT DETECTED NOT DETECTED Final   Pseudomonas aeruginosa NOT DETECTED NOT DETECTED Final   Stenotrophomonas maltophilia NOT DETECTED NOT DETECTED Final   Candida albicans NOT DETECTED NOT DETECTED Final   Candida auris NOT DETECTED NOT DETECTED Final   Candida glabrata NOT DETECTED NOT DETECTED Final   Candida krusei NOT DETECTED NOT DETECTED Final   Candida parapsilosis NOT DETECTED NOT DETECTED Final   Candida tropicalis NOT DETECTED NOT DETECTED Final   Cryptococcus neoformans/gattii NOT DETECTED NOT DETECTED Final    Comment: Performed at [facility], [address], [place],  [place] [address]  Culture, respiratory (non-expectorated)     Status: None   Collection Time: 09/09/20  6:05 PM   Specimen: Tracheal Aspirate; Respiratory  Result Value Ref Range [id number]  Specimen Description TRACHEAL ASPIRATE  Final   Special Requests NONE  Final   Gram Stain   Final    MODERATE WBC PRESENT, PREDOMINANTLY PMN ABUNDANT GRAM NEGATIVE RODS Performed at [facility], [address], [place], [place] [address]    Culture MODERATE PSEUDOMONAS AERUGINOSA  Final   Report Status 09/12/2020 FINAL  Final   Organism ID, Bacteria PSEUDOMONAS AERUGINOSA  Final      Susceptibility   Pseudomonas aeruginosa - MIC*    CEFTAZIDIME 8 SENSITIVE Sensitive     CIPROFLOXACIN 1 SENSITIVE Sensitive     GENTAMICIN <=1  SENSITIVE Sensitive     IMIPENEM 1 SENSITIVE Sensitive     * MODERATE PSEUDOMONAS AERUGINOSA  Culture, Urine     Status: Abnormal   Collection Time: 09/10/20  3:55 PM   Specimen: Urine, Random  Result Value Ref Range Status   Specimen Description URINE, RANDOM  Final   Special Requests   Final    NONE Performed at [facility], [address], [place], [place] [address]    Culture MULTIPLE SPECIES PRESENT, SUGGEST RECOLLECTION (A)  Final   Report Status 09/11/2020 FINAL  Final  Culture, blood (routine x 2)     Status: None   Collection Time: 09/11/20  6:12 PM   Specimen: BLOOD  Result Value Ref Range Status   Specimen Description BLOOD RIGHT ANTECUBITAL  Final   Special Requests   Final    BOTTLES DRAWN AEROBIC AND ANAEROBIC Blood Culture adequate volume   Culture   Final    NO GROWTH 5 DAYS Performed at [facility], [address], [place], [place] [address]    Report Status 09/16/2020 FINAL  Final  Culture, blood (routine x 2)     Status: None   Collection Time: 09/11/20  6:17 PM   Specimen: BLOOD  Result Value Ref Range Status   Specimen Description BLOOD LEFT ANTECUBITAL  Final   Special Requests   Final    BOTTLES DRAWN AEROBIC AND ANAEROBIC Blood Culture adequate volume   Culture   Final    NO GROWTH 5 DAYS Performed at [facility], [address] [place], [place] [address]    Report Status 09/16/2020 FINAL  Final         Radiology Studies: DG CHEST PORT 1 VIEW  Result Date: 09/16/2020 CLINICAL DATA:  Pneumonia.  Pneumothorax. EXAM: PORTABLE CHEST 1 VIEW COMPARISON:  09/15/2020 FINDINGS: Tracheostomy tube tip is above the carina. There is scratch set 2 right-sided central venous catheters are noted with tips in the cavoatrial junction. Stable position of right chest tube. Small pneumothorax overlying the right upper and right mid lung. This is unchanged when compared with previous exam. Diffuse bilateral interstitial and airspace densities are  identified. Similar appearance of right supraclavicular and right chest wall subcutaneous emphysema. IMPRESSION: 1. No change in aeration to the lungs compared with previous exam. 2. Unchanged position of right chest tube with stable right pneumothorax. 3. Stable support apparatus. Electronically Signed   By: Signa Kell M.D.   On: 09/16/2020 05:33   DG Chest Port 1 View  Result Date: 09/15/2020 CLINICAL DATA:  Pneumonia.  Pneumothorax.  Chest tube. EXAM: PORTABLE CHEST 1 VIEW COMPARISON:  09/14/2020. FINDINGS: Tracheostomy tube right central  lines, right chest tube in stable position. Small right pneumothorax again noted. Stable cardiomegaly. Severe diffuse bilateral pulmonary infiltrates/edema again noted. No interim change. No pleural effusion. Extensive chest wall subcutaneous emphysema again noted. IMPRESSION: 1. Lines and tubes including right chest tube in stable position. Small pneumothorax again noted. Extensive chest wall subcutaneous emphysema again noted. 2. Severe diffuse bilateral pulmonary infiltrates/edema again noted. No interim change. Electronically Signed   By: Maisie Fus  Register   On: 09/15/2020 05:41    Scheduled Meds: Please see MAR    Vonzella Nipple, MD  09/16/2020, 5:42 PM

## 2020-09-16 NOTE — Progress Notes (Signed)
Pulmonary Critical Care Medicine [facility] [place]   PULMONARY CRITICAL CARE SERVICE  PROGRESS NOTE  Date of Service: 09/16/2020  Aimee Peck  OBS:[id number]  DOB: [birth year]   DOA: 2020-08-26  Referring Physician: Carron Curie, MD  HPI: Aimee Peck is a 74 y.o. female seen for follow up of Acute on Chronic Respiratory Failure. Patient's oxygen requirements were up to 80% today she remains on the ventilator. Remains on a PEEP of 7 seen by nephrology today for dialysis. Chest x-ray was done this morning around 530 and the x-ray shows that pneumothorax is perhaps worsened  Medications: Reviewed on Rounds  Physical Exam:  Vitals: Temperature 96.5 pulse 91 respiratory rate is 38 blood pressure is 97/49 saturations 94%  Ventilator Settings on assist control FiO2 80% PEEP 7 tidal volume is 460  . General: Comfortable at this time . Eyes: Grossly normal lids, irises & conjunctiva . ENT: grossly tongue is normal . Neck: no obvious mass . Cardiovascular: S1 S2 normal no gallop . Respiratory: Scattered rhonchi expansion is equal at this time . Abdomen: soft . Skin: no rash seen on limited exam . Musculoskeletal: not rigid . Psychiatric:unable to assess . Neurologic: no seizure no involuntary movements         Lab Data:   Basic Metabolic Panel: Recent Labs  Lab 09/10/20 0650 09/11/20 1317 09/12/20 0617 09/14/20 0601 09/16/20 0457  NA 136 133* 133* 134* 133*  K 3.8 3.9 4.0 3.5 4.0  CL 99 95* 97* 98 96*  CO2 26 25 23 25 26   GLUCOSE 72 140* 140* 138* 112*  BUN 58* 78* 89* 66* 57*  CREATININE 1.78* 2.15* 2.36* 1.78* 1.60*  CALCIUM 8.2* 8.5* 8.4* 8.4* 8.7*  MG 1.9 1.9  --   --   --   PHOS 4.9* 5.6* 6.1* 3.9 3.5    ABG: Recent Labs  Lab 09/10/20 0510 09/11/20 1245  PHART 7.321* 7.299*  PCO2ART 52.6* 51.5*  PO2ART 76.4* 58.6*  HCO3 26.4 24.5  O2SAT 95.8 89.7    Liver Function Tests: Recent Labs  Lab 09/10/20 0650 09/11/20 1317  09/12/20 0617 09/14/20 0601 09/16/20 0457  ALBUMIN 2.1* 2.0* 1.8* 1.8* 1.9*   No results for input(s): LIPASE, AMYLASE in the last 168 hours. No results for input(s): AMMONIA in the last 168 hours.  CBC: Recent Labs  Lab 09/11/20 1317 09/12/20 0617 09/14/20 0601 09/15/20 0420 09/16/20 0457  WBC 9.3 7.7 6.8 7.1 11.7*  HGB 7.9* 7.7* 7.3* 7.6* 7.5*  HCT 26.0* 25.0* 24.4* 25.6* 25.4*  MCV 97.4 98.4 98.0 98.8 99.6  PLT 128* 130* 107* 102* 120*    Cardiac Enzymes: No results for input(s): CKTOTAL, CKMB, CKMBINDEX, TROPONINI in the last 168 hours.  BNP (last 3 results) No results for input(s): BNP in the last 8760 hours.  ProBNP (last 3 results) No results for input(s): PROBNP in the last 8760 hours.  Radiological Exams: DG CHEST PORT 1 VIEW  Result Date: 09/16/2020 CLINICAL DATA:  Pneumonia.  Pneumothorax. EXAM: PORTABLE CHEST 1 VIEW COMPARISON:  09/15/2020 FINDINGS: Tracheostomy tube tip is above the carina. There is scratch set 2 right-sided central venous catheters are noted with tips in the cavoatrial junction. Stable position of right chest tube. Small pneumothorax overlying the right upper and right mid lung. This is unchanged when compared with previous exam. Diffuse bilateral interstitial and airspace densities are identified. Similar appearance of right supraclavicular and right chest wall subcutaneous emphysema. IMPRESSION: 1. No change in aeration to the lungs  compared with previous exam. 2. Unchanged position of right chest tube with stable right pneumothorax. 3. Stable support apparatus. Electronically Signed   By: Signa Kell M.D.   On: 09/16/2020 05:33   DG Chest Port 1 View  Result Date: 09/15/2020 CLINICAL DATA:  Pneumonia.  Pneumothorax.  Chest tube. EXAM: PORTABLE CHEST 1 VIEW COMPARISON:  09/14/2020. FINDINGS: Tracheostomy tube right central lines, right chest tube in stable position. Small right pneumothorax again noted. Stable cardiomegaly. Severe diffuse  bilateral pulmonary infiltrates/edema again noted. No interim change. No pleural effusion. Extensive chest wall subcutaneous emphysema again noted. IMPRESSION: 1. Lines and tubes including right chest tube in stable position. Small pneumothorax again noted. Extensive chest wall subcutaneous emphysema again noted. 2. Severe diffuse bilateral pulmonary infiltrates/edema again noted. No interim change. Electronically Signed   By: Maisie Fus  Register   On: 09/15/2020 05:41    Assessment/Plan Active Problems:   Acute on chronic respiratory failure with hypoxia (HCC)   COVID-19 virus infection   Atrial fibrillation with RVR (HCC)   Acute renal failure due to tubular necrosis (HCC)   Metabolic encephalopathy   1. Acute on chronic respiratory failure hypoxia we will continue with try to titrate the FiO2 down the chest x-ray shows a small pneumothorax I think it may be actually worsened from the previous film and may be responsible for the increase in oxygen requirements. In addition there is worsening of the left lung edema versus infiltrate. 2. COVID-19 virus infection in recovery 3. Atrial fibrillation rate is controlled we will continue with supportive care. 4. Acute renal failure continue with present management being seen by nephrology 5. Metabolic encephalopathy no change   I have personally seen and evaluated the patient, evaluated laboratory and imaging results, formulated the assessment and plan and placed orders. The Patient requires high complexity decision making with multiple systems involvement.  Rounds were done with the Respiratory Therapy Director and Staff therapists and discussed with nursing staff also.  Yevonne Pax, MD [facility] Pulmonary Critical Care Medicine Sleep Medicine

## 2020-09-16 NOTE — Progress Notes (Signed)
[facility]  ROUNDING NOTE   Subjective:  Patient continues to have dialysis treatments. Azotemia significantly improved. Still on the ventilator.  Objective:  Vital signs in last 24 hours:  Temperature 96.5 pulse ninety-one respirations thirty blood pressure 97/49  Physical Exam: General:  Critically ill-appearing  Head:  Normocephalic, atraumatic.  Oral mucosa moist  Eyes:  Anicteric  Neck:  Tracheostomy in place  Lungs:   Coarse breath sounds bilateral, vent assisted, right-sided chest tube in place  Heart:  S1S2 no rubs  Abdomen:   Soft, nontender, bowel sounds present  Extremities:  2+ upper and lower extremity edema  Neurologic:  On the ventilator, not following commands  Skin:  No acute rashes  Access:  Right IJ temporary dialysis catheter    Basic Metabolic Panel: Recent Labs  Lab 09/10/20 0650 09/10/20 0650 09/11/20 1317 09/11/20 1317 09/12/20 0617 09/14/20 0601 09/16/20 0457  NA 136  --  133*  --  133* 134* 133*  K 3.8  --  3.9  --  4.0 3.5 4.0  CL 99  --  95*  --  97* 98 96*  CO2 26  --  25  --  GLUCOSE 72  --  140*  --  140* 138* 112*  BUN 58*  --  78*  --  89* 66* 57*  CREATININE 1.78*  --  2.15*  --  2.36* 1.78* 1.60*  CALCIUM 8.2*   < > 8.5*   < > 8.4* 8.4* 8.7*  MG 1.9  --  1.9  --   --   --   --   PHOS 4.9*  --  5.6*  --  6.1* 3.9 3.5   < > = values in this interval not displayed.    Liver Function Tests: Recent Labs  Lab 09/10/20 0650 09/11/20 1317 09/12/20 0617 09/14/20 0601 09/16/20 0457  ALBUMIN 2.1* 2.0* 1.8* 1.8* 1.9*   No results for input(s): LIPASE, AMYLASE in the last 168 hours. No results for input(s): AMMONIA in the last 168 hours.  CBC: Recent Labs  Lab 09/11/20 1317 09/12/20 0617 09/14/20 0601 09/15/20 0420 09/16/20 0457  WBC 9.3 7.7 6.8 7.1 11.7*  HGB 7.9* 7.7* 7.3* 7.6* 7.5*  HCT 26.0* 25.0* 24.4* 25.6* 25.4*  MCV 97.4 98.4 98.0 98.8 99.6  PLT 128* 130* 107* 102* 120*    Cardiac  Enzymes: No results for input(s): CKTOTAL, CKMB, CKMBINDEX, TROPONINI in the last 168 hours.  BNP: Invalid input(s): POCBNP  CBG: No results for input(s): GLUCAP in the last 168 hours.  Microbiology: Results for orders placed or performed during the hospital encounter of 09/07/2020  Urine Culture     Status: Abnormal   Collection Time: 08/17/20  1:32 PM   Specimen: Urine, Random  Result Value Ref Range Status   Specimen Description URINE, RANDOM  Final   Special Requests   Final    NONE Performed at [facility], [address] [place], [place] [address]    Culture >=100,000 COLONIES/mL PSEUDOMONAS AERUGINOSA (A)  Final   Report Status 08/19/2020 FINAL  Final   Organism ID, Bacteria PSEUDOMONAS AERUGINOSA (A)  Final      Susceptibility   Pseudomonas aeruginosa - MIC*    CEFTAZIDIME 16 INTERMEDIATE Intermediate     CIPROFLOXACIN <=0.25 SENSITIVE Sensitive     GENTAMICIN <=1 SENSITIVE Sensitive     IMIPENEM 1 SENSITIVE Sensitive     PIP/TAZO 16 SENSITIVE Sensitive     * >=100,000  COLONIES/mL PSEUDOMONAS AERUGINOSA  Culture, blood (Routine X 2) w Reflex to ID Panel     Status: None   Collection Time: 08/17/20  1:54 PM   Specimen: BLOOD LEFT HAND  Result Value Ref Range Status   Specimen Description BLOOD LEFT HAND  Final   Special Requests   Final    BOTTLES DRAWN AEROBIC AND ANAEROBIC Blood Culture adequate volume   Culture   Final    NO GROWTH 5 DAYS Performed at [facility], [address], [place], [place] [address]    Report Status 08/22/2020 FINAL  Final  Culture, blood (Routine X 2) w Reflex to ID Panel     Status: None   Collection Time: 08/17/20  2:15 PM   Specimen: BLOOD LEFT ARM  Result Value Ref Range Status   Specimen Description BLOOD LEFT ARM  Final   Special Requests   Final    BOTTLES DRAWN AEROBIC ONLY Blood Culture results may not be optimal due to an inadequate volume of blood received in culture bottles   Culture   Final    NO GROWTH 5  DAYS Performed at [facility], [address], [place], [place] [address]    Report Status 08/22/2020 FINAL  Final  Culture, respiratory (non-expectorated)     Status: None   Collection Time: 08/17/20  6:14 PM   Specimen: Tracheal Aspirate; Respiratory  Result Value Ref Range Status   Specimen Description TRACHEAL ASPIRATE  Final   Special Requests NONE  Final   Gram Stain   Final    RARE WBC PRESENT, PREDOMINANTLY PMN FEW GRAM NEGATIVE RODS Performed at [facility], [address] [place], [place] [address]    Culture ABUNDANT PSEUDOMONAS AERUGINOSA  Final   Report Status 08/20/2020 FINAL  Final   Organism ID, Bacteria PSEUDOMONAS AERUGINOSA  Final      Susceptibility   Pseudomonas aeruginosa - MIC*    CEFTAZIDIME 4 SENSITIVE Sensitive     CIPROFLOXACIN <=0.25 SENSITIVE Sensitive     GENTAMICIN <=1 SENSITIVE Sensitive     IMIPENEM 1 SENSITIVE Sensitive     PIP/TAZO 8 SENSITIVE Sensitive     CEFEPIME 2 SENSITIVE Sensitive     * ABUNDANT PSEUDOMONAS AERUGINOSA  Culture, Urine     Status: Abnormal   Collection Time: 08/27/20 11:41 AM   Specimen: Urine, Random  Result Value Ref Range Status   Specimen Description URINE, RANDOM  Final   Special Requests   Final    NONE Performed at [facility], [address], [place], [place] [address]    Culture 70,000 COLONIES/mL YEAST (A)  Final   Report Status 08/28/2020 FINAL  Final  Culture, respiratory (non-expectorated)     Status: None   Collection Time: 08/27/20 12:10 PM   Specimen: Tracheal Aspirate; Respiratory  Result Value Ref Range Status   Specimen Description TRACHEAL ASPIRATE  Final   Special Requests NONE  Final   Gram Stain   Final    MODERATE WBC PRESENT, PREDOMINANTLY PMN NO ORGANISMS SEEN Performed at [facility], [address], [place], [place] [address]    Culture   Final    RARE METHICILLIN RESISTANT STAPHYLOCOCCUS AUREUS RARE PSEUDOMONAS AERUGINOSA    Report Status 08/30/2020  FINAL  Final   Organism ID, Bacteria METHICILLIN RESISTANT STAPHYLOCOCCUS AUREUS  Final   Organism ID, Bacteria PSEUDOMONAS AERUGINOSA  Final      Susceptibility   Methicillin resistant staphylococcus aureus -  MIC*    CIPROFLOXACIN >=8 RESISTANT Resistant     ERYTHROMYCIN >=8 RESISTANT Resistant     GENTAMICIN <=0.5 SENSITIVE Sensitive     OXACILLIN >=4 RESISTANT Resistant     TETRACYCLINE <=1 SENSITIVE Sensitive     VANCOMYCIN <=0.5 SENSITIVE Sensitive     TRIMETH/SULFA <=10 SENSITIVE Sensitive     CLINDAMYCIN <=0.25 SENSITIVE Sensitive     RIFAMPIN <=0.5 SENSITIVE Sensitive     Inducible Clindamycin NEGATIVE Sensitive     * RARE METHICILLIN RESISTANT STAPHYLOCOCCUS AUREUS   Pseudomonas aeruginosa - MIC*    CEFTAZIDIME 16 INTERMEDIATE Intermediate     CIPROFLOXACIN 1 SENSITIVE Sensitive     GENTAMICIN <=1 SENSITIVE Sensitive     IMIPENEM 1 SENSITIVE Sensitive     * RARE PSEUDOMONAS AERUGINOSA  SARS CORONAVIRUS 2 (TAT 6-24 HRS) Nasopharyngeal Nasopharyngeal Swab     Status: Abnormal   Collection Time: 08/31/20  8:20 AM   Specimen: Nasopharyngeal Swab  Result Value Ref Range Status   SARS Coronavirus 2 POSITIVE (A) NEGATIVE Final    Comment: RESULT CALLED TO, READ BACK BY AND VERIFIED WITH: [facility] RN 13:55 08/31/20 ([email or web address]) (NOTE) SARS-CoV-2 target nucleic acids are DETECTED.  The SARS-CoV-2 RNA is generally detectable in upper and lower respiratory specimens during the acute phase of infection. Positive results are indicative of the presence of SARS-CoV-2 RNA. Clinical correlation with patient history and other diagnostic information is  necessary to determine patient infection status. Positive results do not rule out bacterial infection or co-infection with other viruses.  The expected result is Negative.  Fact Sheet for Patients: HairSlick.no  Fact Sheet for Healthcare Providers: quierodirigir.com  This test is not yet  approved or cleared by the [place] FDA and  has been authorized for detection and/or diagnosis of SARS-CoV-2 by FDA under an Emergency Use Authorization (EUA). This EUA will remain  in effect (meaning this test can be used) for  the duration of the COVID-19 declaration under Section 564(b)(1) of the Act, 21 U.S.C. section 360bbb-3(b)(1), unless the authorization is terminated or revoked sooner.   Performed at [facility], [address] [place], [place] [address]   Novel Coronavirus, NAA (Hosp order, Send-out to Ref Lab; TAT 18-24 hrs     Status: None   Collection Time: 08/31/20  2:00 PM   Specimen: Nasopharyngeal Swab; Respiratory  Result Value Ref Range Status   SARS-CoV-2, NAA NOT DETECTED NOT DETECTED Final    Comment: (NOTE) This nucleic acid amplification test was developed and its performance characteristics determined by [employer]. Nucleic acid amplification tests include RT-PCR and TMA. This test has not been FDA cleared or approved. This test has been authorized by FDA under an Emergency Use Authorization (EUA). This test is only authorized for the duration of time the declaration that circumstances exist justifying the authorization of the emergency use of in vitro diagnostic tests for detection of SARS-CoV-2 virus and/or diagnosis of COVID-19 infection under section 564(b)(1) of the Act, 21 U.S.C. 889VQX-4(H) (1), unless the authorization is terminated or revoked sooner. When diagnostic testing is negative, the possibility of a false negative result should be considered in the context of a patient's recent exposures and the presence of clinical signs and symptoms consistent with COVID-19. An individual without symptoms of COVID- 19 and who is not shedding SARS-CoV-2  virus would expect to have a negative (not detected) result in this assay. Performed At: [facility] [address] [place], [place] [id number] Jolene Schimke MD  EN:[id number]    Coronavirus Source NASOPHARYNGEAL  Final    Comment: Performed at [facility], [address], [place], [place] [address]  Culture, blood (routine x 2)     Status: None   Collection Time: 09/09/20 11:29 AM   Specimen: BLOOD RIGHT HAND  Result Value Ref Range Status   Specimen Description BLOOD RIGHT HAND  Final   Special Requests   Final    BOTTLES DRAWN AEROBIC ONLY Blood Culture results may not be optimal due to an inadequate volume of blood received in culture bottles   Culture   Final    NO GROWTH 5 DAYS Performed at [facility], [address], [place], [place] [address]    Report Status 09/14/2020 FINAL  Final  Culture, blood (routine x 2)     Status: Abnormal   Collection Time: 09/09/20 11:34 AM   Specimen: BLOOD LEFT HAND  Result Value Ref Range Status   Specimen Description BLOOD LEFT HAND  Final   Special Requests   Final    BOTTLES DRAWN AEROBIC ONLY Blood Culture results may not be optimal due to an inadequate volume of blood received in culture bottles   Culture  Setup Time   Final    GRAM POSITIVE COCCI IN CLUSTERS AEROBIC BOTTLE ONLY CRITICAL RESULT CALLED TO, READ BACK BY AND VERIFIED WITH: RN S HAN [id number] AT [id number] CM    Culture (A)  Final    STAPHYLOCOCCUS HAEMOLYTICUS THE SIGNIFICANCE OF ISOLATING THIS ORGANISM FROM A SINGLE VENIPUNCTURE CANNOT BE PREDICTED WITHOUT FURTHER CLINICAL AND CULTURE CORRELATION. SUSCEPTIBILITIES AVAILABLE ONLY ON REQUEST. Performed at [facility], [address] [place], [place] [address]    Report Status 09/12/2020 FINAL  Final  Blood Culture ID Panel (Reflexed)     Status: Abnormal   Collection Time: 09/09/20 11:34 AM  Result Value Ref Range Status   Enterococcus faecalis NOT DETECTED NOT DETECTED Final   Enterococcus Faecium NOT DETECTED NOT DETECTED Final   Listeria monocytogenes NOT DETECTED NOT DETECTED Final   Staphylococcus species DETECTED (A) NOT DETECTED Final    Comment:  CRITICAL RESULT CALLED TO, READ BACK BY AND VERIFIED WITH: RN S HAN [id number] AT 1424 BY CM    Staphylococcus aureus (BCID) NOT DETECTED NOT DETECTED Final   Staphylococcus epidermidis NOT DETECTED NOT DETECTED Final   Staphylococcus lugdunensis NOT DETECTED NOT DETECTED Final   Streptococcus species NOT DETECTED NOT DETECTED Final   Streptococcus agalactiae NOT DETECTED NOT DETECTED Final   Streptococcus pneumoniae NOT DETECTED NOT DETECTED Final   Streptococcus pyogenes NOT DETECTED NOT DETECTED Final   A.calcoaceticus-baumannii NOT DETECTED NOT DETECTED Final   Bacteroides fragilis NOT DETECTED NOT DETECTED Final   Enterobacterales NOT DETECTED NOT DETECTED Final   Enterobacter cloacae complex NOT DETECTED NOT DETECTED Final   Escherichia coli NOT DETECTED NOT DETECTED Final   Klebsiella aerogenes NOT DETECTED NOT DETECTED Final   Klebsiella oxytoca NOT DETECTED NOT DETECTED Final   Klebsiella pneumoniae NOT DETECTED NOT DETECTED Final   Proteus species NOT DETECTED NOT DETECTED Final   Salmonella species NOT DETECTED NOT DETECTED Final   Serratia marcescens NOT DETECTED NOT DETECTED Final   Haemophilus influenzae NOT DETECTED NOT DETECTED Final   Neisseria meningitidis NOT DETECTED NOT DETECTED Final   Pseudomonas aeruginosa NOT DETECTED NOT DETECTED Final   Stenotrophomonas maltophilia NOT DETECTED NOT DETECTED Final   Candida albicans NOT DETECTED NOT DETECTED Final   Candida auris NOT DETECTED NOT DETECTED  Final   Candida glabrata NOT DETECTED NOT DETECTED Final   Candida krusei NOT DETECTED NOT DETECTED Final   Candida parapsilosis NOT DETECTED NOT DETECTED Final   Candida tropicalis NOT DETECTED NOT DETECTED Final   Cryptococcus neoformans/gattii NOT DETECTED NOT DETECTED Final    Comment: Performed at [facility], [address] [place], [place] [address]  Culture, respiratory (non-expectorated)     Status: None   Collection Time: 09/09/20  6:05 PM   Specimen:  Tracheal Aspirate; Respiratory  Result Value Ref Range Status   Specimen Description TRACHEAL ASPIRATE  Final   Special Requests NONE  Final   Gram Stain   Final    MODERATE WBC PRESENT, PREDOMINANTLY PMN ABUNDANT GRAM NEGATIVE RODS Performed at [facility], [address], [place], [place] [address]    Culture MODERATE PSEUDOMONAS AERUGINOSA  Final   Report Status 09/12/2020 FINAL  Final   Organism ID, Bacteria PSEUDOMONAS AERUGINOSA  Final      Susceptibility   Pseudomonas aeruginosa - MIC*    CEFTAZIDIME 8 SENSITIVE Sensitive     CIPROFLOXACIN 1 SENSITIVE Sensitive     GENTAMICIN <=1 SENSITIVE Sensitive     IMIPENEM 1 SENSITIVE Sensitive     * MODERATE PSEUDOMONAS AERUGINOSA  Culture, Urine     Status: Abnormal   Collection Time: 09/10/20  3:55 PM   Specimen: Urine, Random  Result Value Ref Range Status   Specimen Description URINE, RANDOM  Final   Special Requests   Final    NONE Performed at [facility], [address], [place], [place] [address]    Culture MULTIPLE SPECIES PRESENT, SUGGEST RECOLLECTION (A)  Final   Report Status 09/11/2020 FINAL  Final  Culture, blood (routine x 2)     Status: None   Collection Time: 09/11/20  6:12 PM   Specimen: BLOOD  Result Value Ref Range Status   Specimen Description BLOOD RIGHT ANTECUBITAL  Final   Special Requests   Final    BOTTLES DRAWN AEROBIC AND ANAEROBIC Blood Culture adequate volume   Culture   Final    NO GROWTH 5 DAYS Performed at [facility], [address] [place], [place] [address]    Report Status 09/16/2020 FINAL  Final  Culture, blood (routine x 2)     Status: None   Collection Time: 09/11/20  6:17 PM   Specimen: BLOOD  Result Value Ref Range Status   Specimen Description BLOOD LEFT ANTECUBITAL  Final   Special Requests   Final    BOTTLES DRAWN AEROBIC AND ANAEROBIC Blood Culture adequate volume   Culture   Final    NO GROWTH 5 DAYS Performed at [facility], [address], [place], [place] [address]    Report Status 09/16/2020 FINAL  Final    Coagulation Studies: No results for input(s): LABPROT, INR in the last 72 hours.  Urinalysis: No results for input(s): COLORURINE, LABSPEC, PHURINE, GLUCOSEU, HGBUR, BILIRUBINUR, KETONESUR, PROTEINUR, UROBILINOGEN, NITRITE, LEUKOCYTESUR in the last 72 hours.  Invalid input(s): APPERANCEUR    Imaging: DG CHEST PORT 1 VIEW  Result Date: 09/16/2020 CLINICAL DATA:  Pneumonia.  Pneumothorax. EXAM: PORTABLE CHEST 1 VIEW COMPARISON:  09/15/2020 FINDINGS: Tracheostomy tube tip is above the carina. There is scratch set 2 right-sided central venous catheters are noted with tips in the cavoatrial junction. Stable position of right chest tube. Small pneumothorax overlying the right upper and right mid lung. This is unchanged when compared  with previous exam. Diffuse bilateral interstitial and airspace densities are identified. Similar appearance of right supraclavicular and right chest wall subcutaneous emphysema. IMPRESSION: 1. No change in aeration to the lungs compared with previous exam. 2. Unchanged position of right chest tube with stable right pneumothorax. 3. Stable support apparatus. Electronically Signed   By: Signa Kellaylor  Stroud M.D.   On: 09/16/2020 05:33   DG Chest Port 1 View  Result Date: 09/15/2020 CLINICAL DATA:  Pneumonia.  Pneumothorax.  Chest tube. EXAM: PORTABLE CHEST 1 VIEW COMPARISON:  09/14/2020. FINDINGS: Tracheostomy tube right central lines, right chest tube in stable position. Small right pneumothorax again noted. Stable cardiomegaly. Severe diffuse bilateral pulmonary infiltrates/edema again noted. No interim change. No pleural effusion. Extensive chest wall subcutaneous emphysema again noted. IMPRESSION: 1. Lines and tubes including right chest tube in stable position. Small pneumothorax again noted. Extensive chest wall subcutaneous emphysema again noted. 2. Severe diffuse bilateral pulmonary infiltrates/edema  again noted. No interim change. Electronically Signed   By: Maisie Fushomas  Register   On: 09/15/2020 05:41     Medications:       Assessment/ Plan:  74 y.o. female with a PMHx of recent acute respiratory failure secondary to COVID-19 pneumonia, hypertension, hyperlipidemia, diabetes mellitus type 2, depression, anxiety, obesity, atrial fibrillation, history of MRSA bacteremia and fungal pneumonia, recent acute kidney injury, who was admitted to [facility] on 09/04/2020 for ongoing treatment of acute respiratory failure.   1.  Acute kidney injury.    Overall azotemia significantly improved.  We will maintain the patient on dialysis for now.  2.  Hyponatremia.  Serum sodium currently 133.  Continue to monitor serum sodium periodically.  3.  Acute respiratory failure/tension pneumothorax.  Remains on the vent.  Chest tube management as per primary team.  4.  Generalized edema/volume overload.  Edema status has improved somewhat with initiation of dialysis.  Continue ultrafiltration with dialysis sessions.     LOS: 0 Aimee Peck 11/5/20218:07 AM

## 2020-09-17 ENCOUNTER — Other Ambulatory Visit (HOSPITAL_COMMUNITY): Payer: Medicare Other

## 2020-09-18 ENCOUNTER — Other Ambulatory Visit (HOSPITAL_COMMUNITY): Payer: Medicare Other

## 2020-09-18 DIAGNOSIS — J9621 Acute and chronic respiratory failure with hypoxia: Secondary | ICD-10-CM | POA: Diagnosis not present

## 2020-09-18 DIAGNOSIS — N170 Acute kidney failure with tubular necrosis: Secondary | ICD-10-CM | POA: Diagnosis not present

## 2020-09-18 DIAGNOSIS — U071 COVID-19: Secondary | ICD-10-CM | POA: Diagnosis not present

## 2020-09-18 DIAGNOSIS — I4891 Unspecified atrial fibrillation: Secondary | ICD-10-CM | POA: Diagnosis not present

## 2020-09-18 NOTE — Progress Notes (Signed)
Pulmonary Critical Care Medicine [facility]   PULMONARY CRITICAL CARE SERVICE  PROGRESS NOTE  Date of Service: 09/18/2020  EARLY ORD  NAT:[id number]  DOB: 1946-05-28   DOA: 08/25/2020  Referring Physician: Carron Curie, MD  HPI: Aimee Peck is a 74 y.o. female seen for follow up of Acute on Chronic Respiratory Failure.  Patient currently is on assist control mode oxygen requirements have gone up to 80%  Medications: Reviewed on Rounds  Physical Exam:  Vitals: Temperature 96.1 pulse 120 respiratory 30 blood pressure is 115/65 saturations 93%  Ventilator Settings on assist control FiO2 needs 80% tidal volume 460 PEEP 7  . General: Comfortable at this time . Eyes: Grossly normal lids, irises & conjunctiva . ENT: grossly tongue is normal . Neck: no obvious mass . Cardiovascular: S1 S2 normal no gallop . Respiratory: No rhonchi very coarse breath sounds . Abdomen: soft . Skin: no rash seen on limited exam . Musculoskeletal: not rigid . Psychiatric:unable to assess . Neurologic: no seizure no involuntary movements         Lab Data:   Basic Metabolic Panel: Recent Labs  Lab 09/11/20 1317 09/12/20 0617 09/14/20 0601 09/16/20 0457  NA 133* 133* 134* 133*  K 3.9 4.0 3.5 4.0  CL 95* 97* 98 96*  CO2 25 23 25 26   GLUCOSE 140* 140* 138* 112*  BUN 78* 89* 66* 57*  CREATININE 2.15* 2.36* 1.78* 1.60*  CALCIUM 8.5* 8.4* 8.4* 8.7*  MG 1.9  --   --   --   PHOS 5.6* 6.1* 3.9 3.5    ABG: Recent Labs  Lab 09/11/20 1245  PHART 7.299*  PCO2ART 51.5*  PO2ART 58.6*  HCO3 24.5  O2SAT 89.7    Liver Function Tests: Recent Labs  Lab 09/11/20 1317 09/12/20 0617 09/14/20 0601 09/16/20 0457  ALBUMIN 2.0* 1.8* 1.8* 1.9*   No results for input(s): LIPASE, AMYLASE in the last 168 hours. No results for input(s): AMMONIA in the last 168 hours.  CBC: Recent Labs  Lab 09/11/20 1317 09/12/20 0617 09/14/20 0601 09/15/20 0420 09/16/20 0457   WBC 9.3 7.7 6.8 7.1 11.7*  HGB 7.9* 7.7* 7.3* 7.6* 7.5*  HCT 26.0* 25.0* 24.4* 25.6* 25.4*  MCV 97.4 98.4 98.0 98.8 99.6  PLT 128* 130* 107* 102* 120*    Cardiac Enzymes: No results for input(s): CKTOTAL, CKMB, CKMBINDEX, TROPONINI in the last 168 hours.  BNP (last 3 results) No results for input(s): BNP in the last 8760 hours.  ProBNP (last 3 results) No results for input(s): PROBNP in the last 8760 hours.  Radiological Exams: DG CHEST PORT 1 VIEW  Result Date: 09/18/2020 CLINICAL DATA:  Pneumothorax.  Follow-up. EXAM: PORTABLE CHEST 1 VIEW COMPARISON:  09/17/2020 FINDINGS: The tracheostomy tube tip is above the carina. There is a right sided chest tube. No pneumothorax visible. Right-sided central venous catheters are noted with tips in the right atrium. Stable cardiomediastinal contours. Diffuse interstitial and airspace densities are noted compatible with COVID-19 pneumonia. This is not significantly changed compared with previous study. Decrease soft tissue gas within the right chest wall. IMPRESSION: 1. Stable position of right chest tube.  No pneumothorax identified. 2. No change in aeration to the lungs compared with previous study. Electronically Signed   By: 13/04/2020 M.D.   On: 09/18/2020 07:27   DG CHEST PORT 1 VIEW  Result Date: 09/17/2020 CLINICAL DATA:  Follow-up pneumothorax. Pneumonia due to COVID-19 infection. EXAM: PORTABLE CHEST 1 VIEW COMPARISON:  09/16/2020  FINDINGS: Tracheostomy tube tip is above the carina. There is a scratch set there are 2 right-sided catheters with tips in the cavoatrial junction. Right-sided chest tube is stable in position. The right-sided pneumothorax has decreased in volume when compared with previous exam and is no longer perceptible. Similar soft tissue gas within the right chest wall. Stable cardiomediastinal contours. Diffuse bilateral interstitial and airspace densities noted compatible with multifocal infection. Unchanged from  previous exam. IMPRESSION: 1. Interval decrease in volume of right-sided pneumothorax. Stable right chest tube. 2. No change in aeration to the lungs compared with previous exam. Electronically Signed   By: Signa Kell M.D.   On: 09/17/2020 06:49    Assessment/Plan Active Problems:   Acute on chronic respiratory failure with hypoxia (HCC)   COVID-19 virus infection   Atrial fibrillation with RVR (HCC)   Acute renal failure due to tubular necrosis (HCC)   Metabolic encephalopathy   1. Acute on chronic respiratory failure with hypoxia we will continue with full support on the ventilator try to titrate FiO2 down.  There is some improvement of the right-sided pneumothorax which has been noted.  Aeration of the lungs however remains unchanged 2. COVID-19 virus infection in recovery we will continue to follow 3. Atrial fibrillation Rate is controlled 4. Metabolic encephalopathy no change we will continue to monitor closely.   I have personally seen and evaluated the patient, evaluated laboratory and imaging results, formulated the assessment and plan and placed orders. The Patient requires high complexity decision making with multiple systems involvement.  Rounds were done with the Respiratory Therapy Director and Staff therapists and discussed with nursing staff also.  Yevonne Pax, MD [facility] Pulmonary Critical Care Medicine Sleep Medicine

## 2020-09-19 ENCOUNTER — Other Ambulatory Visit (HOSPITAL_COMMUNITY): Payer: Medicare Other

## 2020-09-19 DIAGNOSIS — N170 Acute kidney failure with tubular necrosis: Secondary | ICD-10-CM | POA: Diagnosis not present

## 2020-09-19 DIAGNOSIS — U071 COVID-19: Secondary | ICD-10-CM | POA: Diagnosis not present

## 2020-09-19 DIAGNOSIS — I4891 Unspecified atrial fibrillation: Secondary | ICD-10-CM | POA: Diagnosis not present

## 2020-09-19 DIAGNOSIS — J9621 Acute and chronic respiratory failure with hypoxia: Secondary | ICD-10-CM | POA: Diagnosis not present

## 2020-09-19 LAB — RENAL FUNCTION PANEL
Albumin: 1.8 g/dL — ABNORMAL LOW (ref 3.5–5.0)
Anion gap: 11 (ref 5–15)
BUN: 81 mg/dL — ABNORMAL HIGH (ref 8–23)
CO2: 27 mmol/L (ref 22–32)
Calcium: 9 mg/dL (ref 8.9–10.3)
Chloride: 96 mmol/L — ABNORMAL LOW (ref 98–111)
Creatinine, Ser: 2 mg/dL — ABNORMAL HIGH (ref 0.44–1.00)
GFR, Estimated: 26 mL/min — ABNORMAL LOW (ref >60)
Glucose, Bld: 123 mg/dL — ABNORMAL HIGH (ref 70–99)
Phosphorus: 4.2 mg/dL (ref 2.5–4.6)
Potassium: 4.1 mmol/L (ref 3.5–5.1)
Sodium: 134 mmol/L — ABNORMAL LOW (ref 135–145)

## 2020-09-19 LAB — CBC
HCT: 22.5 % — ABNORMAL LOW (ref 36.0–46.0)
Hemoglobin: 6.7 g/dL — CL (ref 12.0–15.0)
MCH: 29.9 pg (ref 26.0–34.0)
MCHC: 29.8 g/dL — ABNORMAL LOW (ref 30.0–36.0)
MCV: 100.4 fL — ABNORMAL HIGH (ref 80.0–100.0)
Platelets: 124 10*3/uL — ABNORMAL LOW (ref 150–400)
RBC: 2.24 MIL/uL — ABNORMAL LOW (ref 3.87–5.11)
RDW: 19.9 % — ABNORMAL HIGH (ref 11.5–15.5)
WBC: 14.8 10*3/uL — ABNORMAL HIGH (ref 4.0–10.5)
nRBC: 5.6 % — ABNORMAL HIGH (ref 0.0–0.2)

## 2020-09-19 LAB — VANCOMYCIN, TROUGH: Vancomycin Tr: 14 ug/mL — ABNORMAL LOW (ref 15–20)

## 2020-09-19 LAB — PREPARE RBC (CROSSMATCH)

## 2020-09-19 NOTE — Progress Notes (Signed)
Pulmonary Critical Care Medicine [facility] [place]   PULMONARY CRITICAL CARE SERVICE  PROGRESS NOTE  Date of Service: 09/19/2020  IKEA DEMICCO  RUE:[id number]  DOB: [birth year]   DOA: 08/18/2020  Referring Physician: Carron Curie, MD  HPI: Aimee Peck is a 74 y.o. female seen for follow up of Acute on Chronic Respiratory Failure.  Patient remains on the ventilator at this time has been on assist control mode oxygen requirements have gone up to 80% she has been dialyzed however not able to pull fluid because of blood pressure issues.  Spoke with PCP consider adding Levophed will discuss with nephrology  Medications: Reviewed on Rounds  Physical Exam:  Vitals: Temperature 96.4 pulse 109 respiratory rate 32 blood pressure was 93/48 saturations 90%  Ventilator Settings on the ventilator assist control mode FiO2 of 80% tidal volume 665 PEEP of 7  . General: Comfortable at this time . Eyes: Grossly normal lids, irises & conjunctiva . ENT: grossly tongue is normal . Neck: no obvious mass . Cardiovascular: S1 S2 normal no gallop . Respiratory: No rhonchi or rales noted at this time . Abdomen: soft . Skin: no rash seen on limited exam . Musculoskeletal: not rigid . Psychiatric:unable to assess . Neurologic: no seizure no involuntary movements         Lab Data:   Basic Metabolic Panel: Recent Labs  Lab 09/14/20 0601 09/16/20 0457 09/19/20 0659  NA 134* 133* 134*  K 3.5 4.0 4.1  CL 98 96* 96*  CO2 25 26 27   GLUCOSE 138* 112* 123*  BUN 66* 57* 81*  CREATININE 1.78* 1.60* 2.00*  CALCIUM 8.4* 8.7* 9.0  PHOS 3.9 3.5 4.2    ABG: No results for input(s): PHART, PCO2ART, PO2ART, HCO3, O2SAT in the last 168 hours.  Liver Function Tests: Recent Labs  Lab 09/14/20 0601 09/16/20 0457 09/19/20 0659  ALBUMIN 1.8* 1.9* 1.8*   No results for input(s): LIPASE, AMYLASE in the last 168 hours. No results for input(s): AMMONIA in the last 168  hours.  CBC: Recent Labs  Lab 09/14/20 0601 09/15/20 0420 09/16/20 0457 09/19/20 0659  WBC 6.8 7.1 11.7* 14.8*  HGB 7.3* 7.6* 7.5* 6.7*  HCT 24.4* 25.6* 25.4* 22.5*  MCV 98.0 98.8 99.6 100.4*  PLT 107* 102* 120* 124*    Cardiac Enzymes: No results for input(s): CKTOTAL, CKMB, CKMBINDEX, TROPONINI in the last 168 hours.  BNP (last 3 results) No results for input(s): BNP in the last 8760 hours.  ProBNP (last 3 results) No results for input(s): PROBNP in the last 8760 hours.  Radiological Exams: DG CHEST PORT 1 VIEW  Result Date: 09/19/2020 CLINICAL DATA:  Pneumothorax and chest tube EXAM: PORTABLE CHEST 1 VIEW COMPARISON:  Yesterday FINDINGS: Small right pneumothorax seen at the apex and laterally. The chest tube is in stable position. Coarse bilateral interstitial of opacity with patchy consolidative density. No visible effusion. Enlarged appearance of the heart. Right-sided central lines and tracheostomy tube in stable position. IMPRESSION: 1. Interval small right pneumothorax. 2. Otherwise stable. Electronically Signed   By: 13/06/2020 M.D.   On: 09/19/2020 05:54   DG CHEST PORT 1 VIEW  Result Date: 09/18/2020 CLINICAL DATA:  Pneumothorax.  Follow-up. EXAM: PORTABLE CHEST 1 VIEW COMPARISON:  09/17/2020 FINDINGS: The tracheostomy tube tip is above the carina. There is a right sided chest tube. No pneumothorax visible. Right-sided central venous catheters are noted with tips in the right atrium. Stable cardiomediastinal contours. Diffuse interstitial and airspace densities are  noted compatible with COVID-19 pneumonia. This is not significantly changed compared with previous study. Decrease soft tissue gas within the right chest wall. IMPRESSION: 1. Stable position of right chest tube.  No pneumothorax identified. 2. No change in aeration to the lungs compared with previous study. Electronically Signed   By: Signa Kell M.D.   On: 09/18/2020 07:27    Assessment/Plan Active  Problems:   Acute on chronic respiratory failure with hypoxia (HCC)   COVID-19 virus infection   Atrial fibrillation with RVR (HCC)   Acute renal failure due to tubular necrosis (HCC)   Metabolic encephalopathy   1. Acute on chronic respiratory failure hypoxia we will continue with full support right now is on 80% FiO2 patient's x-ray showing fairly diffuse changes without really significant improvement the patient needs to have more fluid pulled off if at all possible might need to add pressors 2. COVID-19 virus infection right now is in recovery 3. Atrial fibrillation RVR rate controlled 4. Acute renal failure on dialysis 5. Metabolic encephalopathy no improvement   I have personally seen and evaluated the patient, evaluated laboratory and imaging results, formulated the assessment and plan and placed orders. The Patient requires high complexity decision making with multiple systems involvement.  Rounds were done with the Respiratory Therapy Director and Staff therapists and discussed with nursing staff also.  Yevonne Pax, MD [facility] Pulmonary Critical Care Medicine Sleep Medicine

## 2020-09-19 NOTE — Progress Notes (Signed)
[facility]  ROUNDING NOTE   Subjective:  Patient remains ventilator dependent. Seen and evaluated during dialysis treatment today.  Objective:  Vital signs in last 24 hours:  Temperature 96.4 pulse 109 respirations 22 blood pressure 93/49  Physical Exam: General:  Critically ill-appearing  Head:  Normocephalic, atraumatic.  Oral mucosa moist  Eyes:  Anicteric  Neck:  Tracheostomy in place  Lungs:   Coarse breath sounds bilateral, vent assisted  Heart:  S1S2 no rubs  Abdomen:   Soft, nontender, bowel sounds present  Extremities:  2+ upper and lower extremity edema  Neurologic:  On the ventilator, not following commands  Skin:  No acute rashes  Access:  Right IJ temporary dialysis catheter    Basic Metabolic Panel: Recent Labs  Lab 09/14/20 0601 09/16/20 0457 09/19/20 0659  NA 134* 133* 134*  K 3.5 4.0 4.1  CL 98 96* 96*  CO2 GLUCOSE 138* 112* 123*  BUN 66* 57* 81*  CREATININE 1.78* 1.60* 2.00*  CALCIUM 8.4* 8.7* 9.0  PHOS 3.9 3.5 4.2    Liver Function Tests: Recent Labs  Lab 09/14/20 0601 09/16/20 0457 09/19/20 0659  ALBUMIN 1.8* 1.9* 1.8*   No results for input(s): LIPASE, AMYLASE in the last 168 hours. No results for input(s): AMMONIA in the last 168 hours.  CBC: Recent Labs  Lab 09/14/20 0601 09/15/20 0420 09/16/20 0457 09/19/20 0659  WBC 6.8 7.1 11.7* 14.8*  HGB 7.3* 7.6* 7.5* 6.7*  HCT 24.4* 25.6* 25.4* 22.5*  MCV 98.0 98.8 99.6 100.4*  PLT 107* 102* 120* 124*    Cardiac Enzymes: No results for input(s): CKTOTAL, CKMB, CKMBINDEX, TROPONINI in the last 168 hours.  BNP: Invalid input(s): POCBNP  CBG: No results for input(s): GLUCAP in the last 168 hours.  Microbiology: Results for orders placed or performed during the hospital encounter of 05-Sep-2020  Urine Culture     Status: Abnormal   Collection Time: 08/17/20  1:32 PM   Specimen: Urine, Random  Result Value Ref Range Status   Specimen Description URINE,  RANDOM  Final   Special Requests   Final    NONE Performed at [facility], [address], [place], [place] [address]    Culture >=100,000 COLONIES/mL PSEUDOMONAS AERUGINOSA (A)  Final   Report Status 08/19/2020 FINAL  Final   Organism ID, Bacteria PSEUDOMONAS AERUGINOSA (A)  Final      Susceptibility   Pseudomonas aeruginosa - MIC*    CEFTAZIDIME 16 INTERMEDIATE Intermediate     CIPROFLOXACIN <=0.25 SENSITIVE Sensitive     GENTAMICIN <=1 SENSITIVE Sensitive     IMIPENEM 1 SENSITIVE Sensitive     PIP/TAZO 16 SENSITIVE Sensitive     * >=100,000 COLONIES/mL PSEUDOMONAS AERUGINOSA  Culture, blood (Routine X 2) w Reflex to ID Panel     Status: None   Collection Time: 08/17/20  1:54 PM   Specimen: BLOOD LEFT HAND  Result Value Ref Range Status   Specimen Description BLOOD LEFT HAND  Final   Special Requests   Final    BOTTLES DRAWN AEROBIC AND ANAEROBIC Blood Culture adequate volume   Culture   Final    NO GROWTH 5 DAYS Performed at [facility], [address], [place], [place] [address]    Report Status 08/22/2020 FINAL  Final  Culture, blood (Routine X 2) w Reflex to ID Panel     Status: None   Collection Time: 08/17/20  2:15 PM   Specimen: BLOOD  LEFT ARM  Result Value Ref Range Status   Specimen Description BLOOD LEFT ARM  Final   Special Requests   Final    BOTTLES DRAWN AEROBIC ONLY Blood Culture results may not be optimal due to an inadequate volume of blood received in culture bottles   Culture   Final    NO GROWTH 5 DAYS Performed at [facility], [address], [place], [place] [address]    Report Status 08/22/2020 FINAL  Final  Culture, respiratory (non-expectorated)     Status: None   Collection Time: 08/17/20  6:14 PM   Specimen: Tracheal Aspirate; Respiratory  Result Value Ref Range Status   Specimen Description TRACHEAL ASPIRATE  Final   Special Requests NONE  Final   Gram Stain   Final    RARE WBC PRESENT, PREDOMINANTLY PMN FEW GRAM  NEGATIVE RODS Performed at [facility], [address], [place], [place] [address]    Culture ABUNDANT PSEUDOMONAS AERUGINOSA  Final   Report Status 08/20/2020 FINAL  Final   Organism ID, Bacteria PSEUDOMONAS AERUGINOSA  Final      Susceptibility   Pseudomonas aeruginosa - MIC*    CEFTAZIDIME 4 SENSITIVE Sensitive     CIPROFLOXACIN <=0.25 SENSITIVE Sensitive     GENTAMICIN <=1 SENSITIVE Sensitive     IMIPENEM 1 SENSITIVE Sensitive     PIP/TAZO 8 SENSITIVE Sensitive     CEFEPIME 2 SENSITIVE Sensitive     * ABUNDANT PSEUDOMONAS AERUGINOSA  Culture, Urine     Status: Abnormal   Collection Time: 08/27/20 11:41 AM   Specimen: Urine, Random  Result Value Ref Range Status   Specimen Description URINE, RANDOM  Final   Special Requests   Final    NONE Performed at [facility], [address] [place], [place] [address]    Culture 70,000 COLONIES/mL YEAST (A)  Final   Report Status 08/28/2020 FINAL  Final  Culture, respiratory (non-expectorated)     Status: None   Collection Time: 08/27/20 12:10 PM   Specimen: Tracheal Aspirate; Respiratory  Result Value Ref Range Status   Specimen Description TRACHEAL ASPIRATE  Final   Special Requests NONE  Final   Gram Stain   Final    MODERATE WBC PRESENT, PREDOMINANTLY PMN NO ORGANISMS SEEN Performed at [facility], [address] [place], [place] [address]    Culture   Final    RARE METHICILLIN RESISTANT STAPHYLOCOCCUS AUREUS RARE PSEUDOMONAS AERUGINOSA    Report Status 08/30/2020 FINAL  Final   Organism ID, Bacteria METHICILLIN RESISTANT STAPHYLOCOCCUS AUREUS  Final   Organism ID, Bacteria PSEUDOMONAS AERUGINOSA  Final      Susceptibility   Methicillin resistant staphylococcus aureus - MIC*    CIPROFLOXACIN >=8 RESISTANT Resistant     ERYTHROMYCIN >=8 RESISTANT Resistant     GENTAMICIN <=0.5 SENSITIVE Sensitive     OXACILLIN >=4 RESISTANT Resistant     TETRACYCLINE <=1 SENSITIVE Sensitive     VANCOMYCIN <=0.5  SENSITIVE Sensitive     TRIMETH/SULFA <=10 SENSITIVE Sensitive     CLINDAMYCIN <=0.25 SENSITIVE Sensitive     RIFAMPIN <=0.5 SENSITIVE Sensitive     Inducible Clindamycin NEGATIVE Sensitive     * RARE METHICILLIN RESISTANT STAPHYLOCOCCUS AUREUS   Pseudomonas aeruginosa - MIC*    CEFTAZIDIME 16 INTERMEDIATE Intermediate     CIPROFLOXACIN 1 SENSITIVE Sensitive     GENTAMICIN <=1 SENSITIVE Sensitive     IMIPENEM 1 SENSITIVE Sensitive     *  RARE PSEUDOMONAS AERUGINOSA  SARS CORONAVIRUS 2 (TAT 6-24 HRS) Nasopharyngeal Nasopharyngeal Swab     Status: Abnormal   Collection Time: 08/31/20  8:20 AM   Specimen: Nasopharyngeal Swab  Result Value Ref Range Status   SARS Coronavirus 2 POSITIVE (A) NEGATIVE Final    Comment: RESULT CALLED TO, READ BACK BY AND VERIFIED WITH: [employer] RN 13:55 08/31/20 ([email or web address]) (NOTE) SARS-CoV-2 target nucleic acids are DETECTED.  The SARS-CoV-2 RNA is generally detectable in upper and lower respiratory specimens during the acute phase of infection. Positive results are indicative of the presence of SARS-CoV-2 RNA. Clinical correlation with patient history and other diagnostic information is  necessary to determine patient infection status. Positive results do not rule out bacterial infection or co-infection with other viruses.  The expected result is Negative.  Fact Sheet for Patients: HairSlick.no  Fact Sheet for Healthcare Providers: quierodirigir.com  This test is not yet approved or cleared by the [place] FDA and  has been authorized for detection and/or diagnosis of SARS-CoV-2 by FDA under an Emergency Use Authorization (EUA). This EUA will remain  in effect (meaning this test can be used) for  the duration of the COVID-19 declaration under Section 564(b)(1) of the Act, 21 U.S.C. section 360bbb-3(b)(1), unless the authorization is terminated or revoked sooner.   Performed at [facility]  Lab, [address], [place], [place] [address]   Novel Coronavirus, NAA (Hosp order, Send-out to Ref Lab; TAT 18-24 hrs     Status: None   Collection Time: 08/31/20  2:00 PM   Specimen: Nasopharyngeal Swab; Respiratory  Result Value Ref Range Status   SARS-CoV-2, NAA NOT DETECTED NOT DETECTED Final    Comment: (NOTE) This nucleic acid amplification test was developed and its performance characteristics determined by [employer]. Nucleic acid amplification tests include RT-PCR and TMA. This test has not been FDA cleared or approved. This test has been authorized by FDA under an Emergency Use Authorization (EUA). This test is only authorized for the duration of time the declaration that circumstances exist justifying the authorization of the emergency use of in vitro diagnostic tests for detection of SARS-CoV-2 virus and/or diagnosis of COVID-19 infection under section 564(b)(1) of the Act, 21 U.S.C. 121FXJ-8(I) (1), unless the authorization is terminated or revoked sooner. When diagnostic testing is negative, the possibility of a false negative result should be considered in the context of a patient's recent exposures and the presence of clinical signs and symptoms consistent with COVID-19. An individual without symptoms of COVID- 19 and who is not shedding SARS-CoV-2  virus would expect to have a negative (not detected) result in this assay. Performed At: [facility] [address] [place], [place] [id number] Jolene Schimke MD BR:[id number]    Coronavirus Source NASOPHARYNGEAL  Final    Comment: Performed at [facility], [address] [place], [place] [address]  Culture, blood (routine x 2)     Status: None   Collection Time: 09/09/20 11:29 AM   Specimen: BLOOD RIGHT HAND  Result Value Ref Range Status   Specimen Description BLOOD RIGHT HAND  Final   Special Requests   Final    BOTTLES DRAWN AEROBIC ONLY Blood Culture results may not be optimal due to an  inadequate volume of blood received in culture bottles   Culture   Final    NO GROWTH 5 DAYS Performed at [facility], [address], [place], [place] [address]    Report Status 09/14/2020 FINAL  Final  Culture, blood (routine x 2)     Status: Abnormal   Collection Time: 09/09/20 11:34 AM   Specimen: BLOOD LEFT HAND  Result Value Ref Range Status   Specimen Description BLOOD LEFT HAND  Final   Special Requests   Final    BOTTLES DRAWN AEROBIC ONLY Blood Culture results may not be optimal due to an inadequate volume of blood received in culture bottles   Culture  Setup Time   Final    GRAM POSITIVE COCCI IN CLUSTERS AEROBIC BOTTLE ONLY CRITICAL RESULT CALLED TO, READ BACK BY AND VERIFIED WITH: RN S HAN [id number] AT [id number] CM    Culture (A)  Final    STAPHYLOCOCCUS HAEMOLYTICUS THE SIGNIFICANCE OF ISOLATING THIS ORGANISM FROM A SINGLE VENIPUNCTURE CANNOT BE PREDICTED WITHOUT FURTHER CLINICAL AND CULTURE CORRELATION. SUSCEPTIBILITIES AVAILABLE ONLY ON REQUEST. Performed at [facility], [address] [place], [place] [address]    Report Status 09/12/2020 FINAL  Final  Blood Culture ID Panel (Reflexed)     Status: Abnormal   Collection Time: 09/09/20 11:34 AM  Result Value Ref Range Status   Enterococcus faecalis NOT DETECTED NOT DETECTED Final   Enterococcus Faecium NOT DETECTED NOT DETECTED Final   Listeria monocytogenes NOT DETECTED NOT DETECTED Final   Staphylococcus species DETECTED (A) NOT DETECTED Final    Comment: CRITICAL RESULT CALLED TO, READ BACK BY AND VERIFIED WITH: RN S HAN [id number] AT 1424 BY CM    Staphylococcus aureus (BCID) NOT DETECTED NOT DETECTED Final   Staphylococcus epidermidis NOT DETECTED NOT DETECTED Final   Staphylococcus lugdunensis NOT DETECTED NOT DETECTED Final   Streptococcus species NOT DETECTED NOT DETECTED Final   Streptococcus agalactiae NOT DETECTED NOT DETECTED Final   Streptococcus pneumoniae NOT DETECTED NOT DETECTED Final    Streptococcus pyogenes NOT DETECTED NOT DETECTED Final   A.calcoaceticus-baumannii NOT DETECTED NOT DETECTED Final   Bacteroides fragilis NOT DETECTED NOT DETECTED Final   Enterobacterales NOT DETECTED NOT DETECTED Final   Enterobacter cloacae complex NOT DETECTED NOT DETECTED Final   Escherichia coli NOT DETECTED NOT DETECTED Final   Klebsiella aerogenes NOT DETECTED NOT DETECTED Final   Klebsiella oxytoca NOT DETECTED NOT DETECTED Final   Klebsiella pneumoniae NOT DETECTED NOT DETECTED Final   Proteus species NOT DETECTED NOT DETECTED Final   Salmonella species NOT DETECTED NOT DETECTED Final   Serratia marcescens NOT DETECTED NOT DETECTED Final   Haemophilus influenzae NOT DETECTED NOT DETECTED Final   Neisseria meningitidis NOT DETECTED NOT DETECTED Final   Pseudomonas aeruginosa NOT DETECTED NOT DETECTED Final   Stenotrophomonas maltophilia NOT DETECTED NOT DETECTED Final   Candida albicans NOT DETECTED NOT DETECTED Final   Candida auris NOT DETECTED NOT DETECTED Final   Candida glabrata NOT DETECTED NOT DETECTED Final   Candida krusei NOT DETECTED NOT DETECTED Final   Candida parapsilosis NOT DETECTED NOT DETECTED Final   Candida tropicalis NOT DETECTED NOT DETECTED Final   Cryptococcus neoformans/gattii NOT DETECTED NOT DETECTED Final    Comment: Performed at [facility], [address] [place], [place] [address]  Culture, respiratory (non-expectorated)     Status: None   Collection Time: 09/09/20  6:05 PM   Specimen: Tracheal Aspirate; Respiratory  Result Value Ref Range Status   Specimen Description TRACHEAL ASPIRATE  Final   Special Requests NONE  Final   Gram Stain   Final    MODERATE WBC PRESENT, PREDOMINANTLY PMN ABUNDANT GRAM NEGATIVE RODS Performed at [facility], [address], [place], [place] [address]    Culture MODERATE PSEUDOMONAS AERUGINOSA  Final   Report Status 09/12/2020 FINAL  Final   Organism ID, Bacteria PSEUDOMONAS AERUGINOSA  Final       Susceptibility   Pseudomonas aeruginosa - MIC*    CEFTAZIDIME 8 SENSITIVE Sensitive     CIPROFLOXACIN 1 SENSITIVE Sensitive     GENTAMICIN <=1 SENSITIVE Sensitive     IMIPENEM 1 SENSITIVE Sensitive     * MODERATE PSEUDOMONAS AERUGINOSA  Culture, Urine     Status: Abnormal   Collection Time: 09/10/20  3:55 PM   Specimen: Urine, Random  Result Value Ref Range Status   Specimen Description URINE, RANDOM  Final   Special Requests   Final    NONE Performed at [facility], [address], [place], [place] [address]    Culture MULTIPLE SPECIES PRESENT, SUGGEST RECOLLECTION (A)  Final   Report Status 09/11/2020 FINAL  Final  Culture, blood (routine x 2)     Status: None   Collection Time: 09/11/20  6:12 PM   Specimen: BLOOD  Result Value Ref Range Status   Specimen Description BLOOD RIGHT ANTECUBITAL  Final   Special Requests   Final    BOTTLES DRAWN AEROBIC AND ANAEROBIC Blood Culture adequate volume   Culture   Final    NO GROWTH 5 DAYS Performed at [facility], [address] [place], [place] [address]    Report Status 09/16/2020 FINAL  Final  Culture, blood (routine x 2)     Status: None   Collection Time: 09/11/20  6:17 PM   Specimen: BLOOD  Result Value Ref Range Status   Specimen Description BLOOD LEFT ANTECUBITAL  Final   Special Requests   Final    BOTTLES DRAWN AEROBIC AND ANAEROBIC Blood Culture adequate volume   Culture   Final    NO GROWTH 5 DAYS Performed at [facility], [address], [place], [place] [address]    Report Status 09/16/2020 FINAL  Final    Coagulation Studies: No results for input(s): LABPROT, INR in the last 72 hours.  Urinalysis: No results for input(s): COLORURINE, LABSPEC, PHURINE, GLUCOSEU, HGBUR, BILIRUBINUR, KETONESUR, PROTEINUR, UROBILINOGEN, NITRITE, LEUKOCYTESUR in the last 72 hours.  Invalid input(s): APPERANCEUR    Imaging: DG CHEST PORT 1 VIEW  Result Date: 09/19/2020 CLINICAL DATA:  Pneumothorax and  chest tube EXAM: PORTABLE CHEST 1 VIEW COMPARISON:  Yesterday FINDINGS: Small right pneumothorax seen at the apex and laterally. The chest tube is in stable position. Coarse bilateral interstitial of opacity with patchy consolidative density. No visible effusion. Enlarged appearance of the heart. Right-sided central lines and tracheostomy tube in stable position. IMPRESSION: 1. Interval small right pneumothorax. 2. Otherwise stable. Electronically Signed   By: Marnee Spring M.D.   On: 09/19/2020 05:54   DG CHEST PORT 1 VIEW  Result Date: 09/18/2020 CLINICAL DATA:  Pneumothorax.  Follow-up. EXAM: PORTABLE CHEST 1 VIEW COMPARISON:  09/17/2020 FINDINGS: The tracheostomy tube tip is above the carina. There is a right sided chest tube. No pneumothorax visible. Right-sided central venous catheters are noted with tips in the right atrium. Stable cardiomediastinal contours. Diffuse interstitial and airspace densities are noted compatible with COVID-19 pneumonia. This is not significantly changed compared with previous study. Decrease soft tissue gas within the right chest wall. IMPRESSION: 1. Stable position of right chest tube.  No pneumothorax identified. 2. No change in aeration to the lungs compared with previous  study. Electronically Signed   By: Signa Kell M.D.   On: 09/18/2020 07:27     Medications:       Assessment/ Plan:  74 y.o. female with a PMHx of recent acute respiratory failure secondary to COVID-19 pneumonia, hypertension, hyperlipidemia, diabetes mellitus type 2, depression, anxiety, obesity, atrial fibrillation, history of MRSA bacteremia and fungal pneumonia, recent acute kidney injury, who was admitted to [facility] on 08/27/2020 for ongoing treatment of acute respiratory failure.   1.  Acute kidney injury.  Pt seen and evaluated during dialysis, tolerating well.  UF target 1.5-2kg.   2.  Hyponatremia.  Na stable at 134, continue to monitor.   3.  Acute respiratory failure/tension  pneumothorax.  Continue current ventilatory support.    4.  Generalized edema/volume overload.  Maintain UF with dialysis, overall improved volume status.  4.  Anemia of chronic kidney disease.  Hemoglobin down to 6.7.  Consider transfusion but defer to primary team.     LOS: 0 Aimee Peck 11/8/20217:56 AM

## 2020-09-20 ENCOUNTER — Other Ambulatory Visit (HOSPITAL_COMMUNITY): Payer: Medicare Other

## 2020-09-20 LAB — BLOOD CULTURE ID PANEL (REFLEXED) - BCID2

## 2020-09-20 LAB — TYPE AND SCREEN
ABO/RH(D): O POS
Antibody Screen: NEGATIVE
Unit division: 0

## 2020-09-20 LAB — CBC
HCT: 28.4 % — ABNORMAL LOW (ref 36.0–46.0)
Hemoglobin: 8.5 g/dL — ABNORMAL LOW (ref 12.0–15.0)
MCH: 30 pg (ref 26.0–34.0)
MCHC: 29.9 g/dL — ABNORMAL LOW (ref 30.0–36.0)
MCV: 100.4 fL — ABNORMAL HIGH (ref 80.0–100.0)
Platelets: 119 10*3/uL — ABNORMAL LOW (ref 150–400)
RBC: 2.83 MIL/uL — ABNORMAL LOW (ref 3.87–5.11)
RDW: 21.2 % — ABNORMAL HIGH (ref 11.5–15.5)
WBC: 18.8 10*3/uL — ABNORMAL HIGH (ref 4.0–10.5)
nRBC: 8.3 % — ABNORMAL HIGH (ref 0.0–0.2)

## 2020-09-20 LAB — PATHOLOGIST SMEAR REVIEW

## 2020-09-20 LAB — BPAM RBC
Blood Product Expiration Date: 202111152359
ISSUE DATE / TIME: 202111081125
Unit Type and Rh: 5100

## 2020-09-21 ENCOUNTER — Other Ambulatory Visit (HOSPITAL_COMMUNITY): Payer: Medicare Other

## 2020-09-21 LAB — RENAL FUNCTION PANEL
Albumin: 1.8 g/dL — ABNORMAL LOW (ref 3.5–5.0)
Anion gap: 11 (ref 5–15)
BUN: 68 mg/dL — ABNORMAL HIGH (ref 8–23)
CO2: 25 mmol/L (ref 22–32)
Calcium: 9.1 mg/dL (ref 8.9–10.3)
Chloride: 97 mmol/L — ABNORMAL LOW (ref 98–111)
Creatinine, Ser: 1.62 mg/dL — ABNORMAL HIGH (ref 0.44–1.00)
GFR, Estimated: 33 mL/min — ABNORMAL LOW (ref >60)
Glucose, Bld: 109 mg/dL — ABNORMAL HIGH (ref 70–99)
Phosphorus: 4.5 mg/dL (ref 2.5–4.6)
Potassium: 4.3 mmol/L (ref 3.5–5.1)
Sodium: 133 mmol/L — ABNORMAL LOW (ref 135–145)

## 2020-09-21 LAB — CBC
HCT: 28.3 % — ABNORMAL LOW (ref 36.0–46.0)
Hemoglobin: 8.5 g/dL — ABNORMAL LOW (ref 12.0–15.0)
MCH: 30.4 pg (ref 26.0–34.0)
MCHC: 30 g/dL (ref 30.0–36.0)
MCV: 101.1 fL — ABNORMAL HIGH (ref 80.0–100.0)
Platelets: 103 10*3/uL — ABNORMAL LOW (ref 150–400)
RBC: 2.8 MIL/uL — ABNORMAL LOW (ref 3.87–5.11)
RDW: 20.9 % — ABNORMAL HIGH (ref 11.5–15.5)
WBC: 16.2 10*3/uL — ABNORMAL HIGH (ref 4.0–10.5)
nRBC: 11.5 % — ABNORMAL HIGH (ref 0.0–0.2)

## 2020-09-21 LAB — MAGNESIUM: Magnesium: 1.9 mg/dL (ref 1.7–2.4)

## 2020-09-24 LAB — CULTURE, RESPIRATORY W GRAM STAIN

## 2020-09-25 LAB — CULTURE, BLOOD (ROUTINE X 2)
Culture: NO GROWTH
Special Requests: ADEQUATE
Special Requests: ADEQUATE

## 2020-10-12 DEATH — deceased

## 2022-06-13 IMAGING — DX DG CHEST 1V PORT
1 series · 1 of 1 positions shown · non-contrast
Comparison: 08/22/2020

CLINICAL DATA: Pneumonia.

EXAM:
PORTABLE CHEST 1 VIEW

[chest ap]
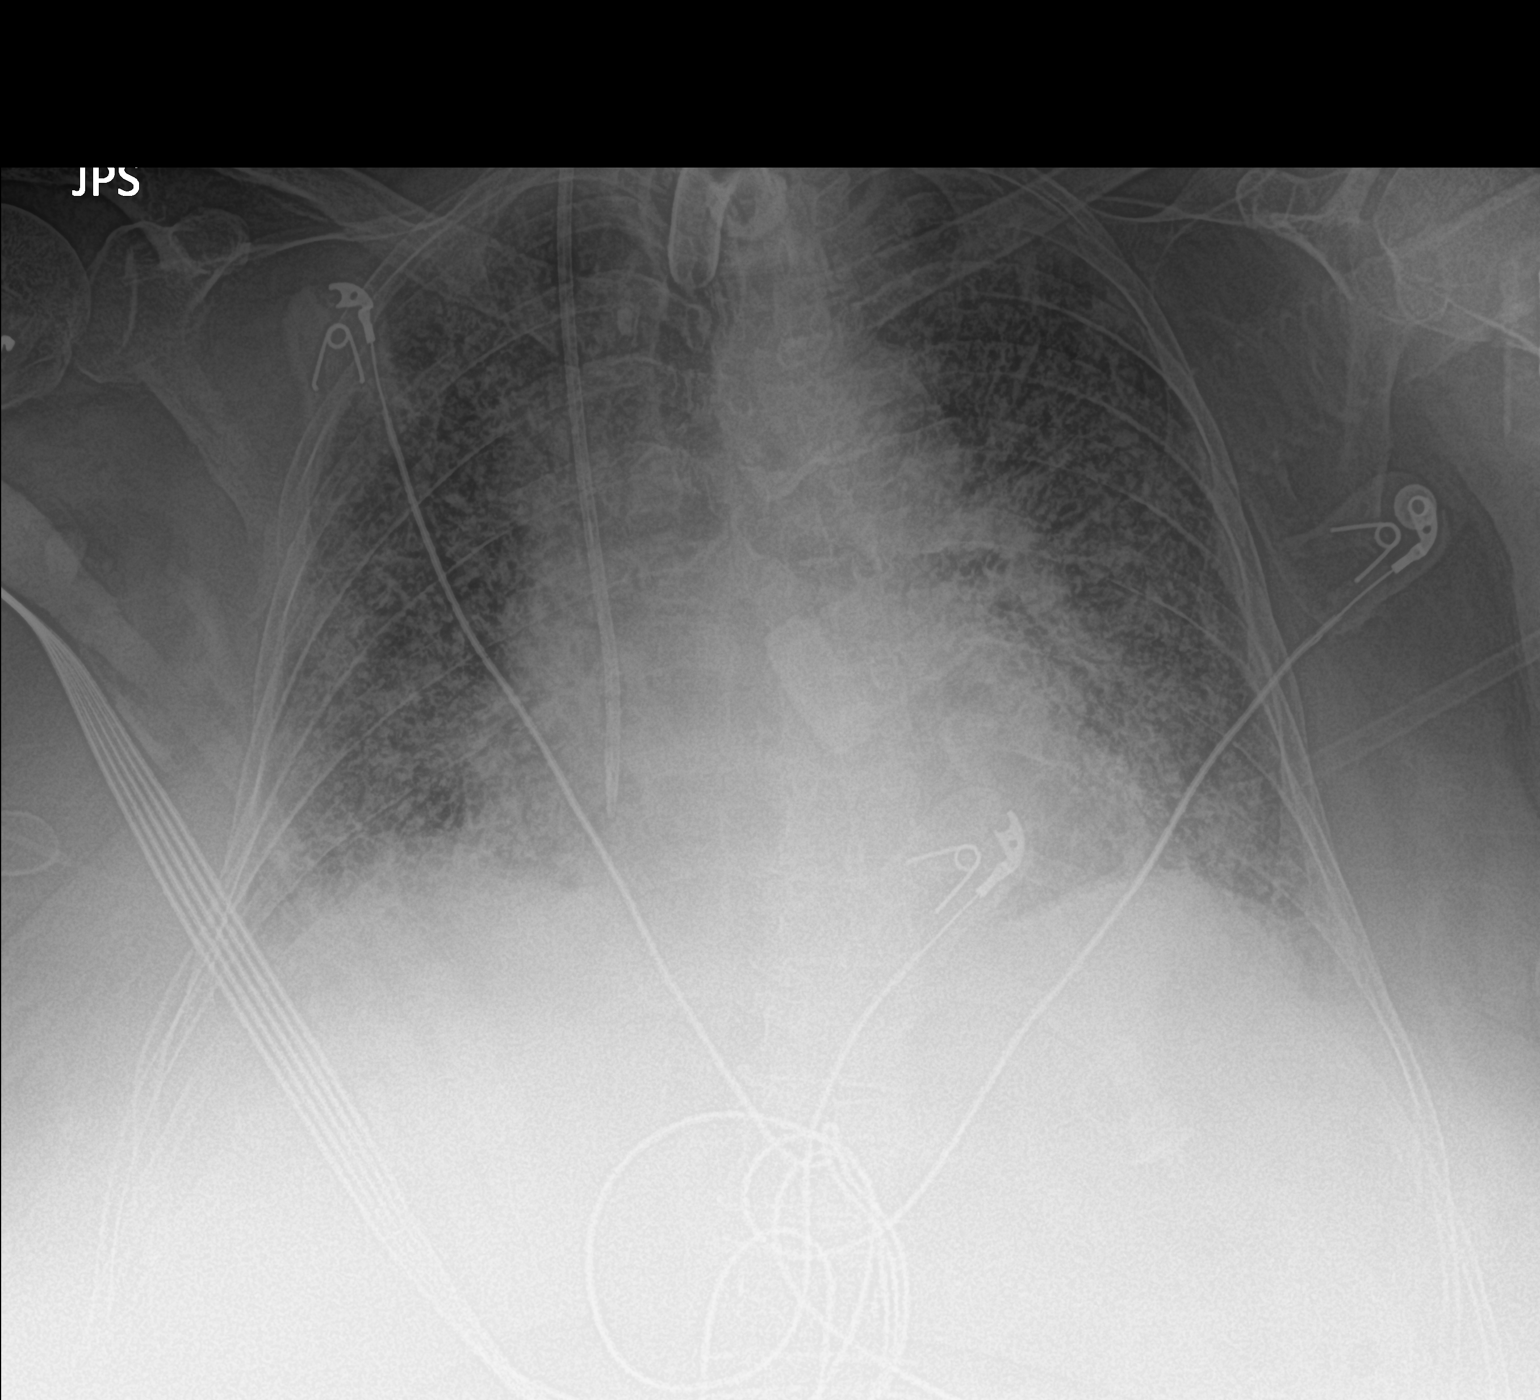

[1 of 1 positions shown; findings below may reference images not displayed]

FINDINGS: 0112 hours. The cardio pericardial silhouette is enlarged.
Tracheostomy tube remains in place. Right IJ central line tip
overlies the right atrium. The diffuse interstitial and airspace
opacity is similar to prior. No substantial pleural effusion. Bones
are demineralized. Telemetry leads overlie the chest.
IMPRESSION: No substantial interval change in exam. Diffuse interstitial and
airspace disease.

## 2022-07-11 IMAGING — DX DG CHEST 1V PORT
1 series · 1 of 1 positions shown · non-contrast
Comparison: Yesterday

CLINICAL DATA: Pneumonia

EXAM:
PORTABLE CHEST 1 VIEW

[chest ap]
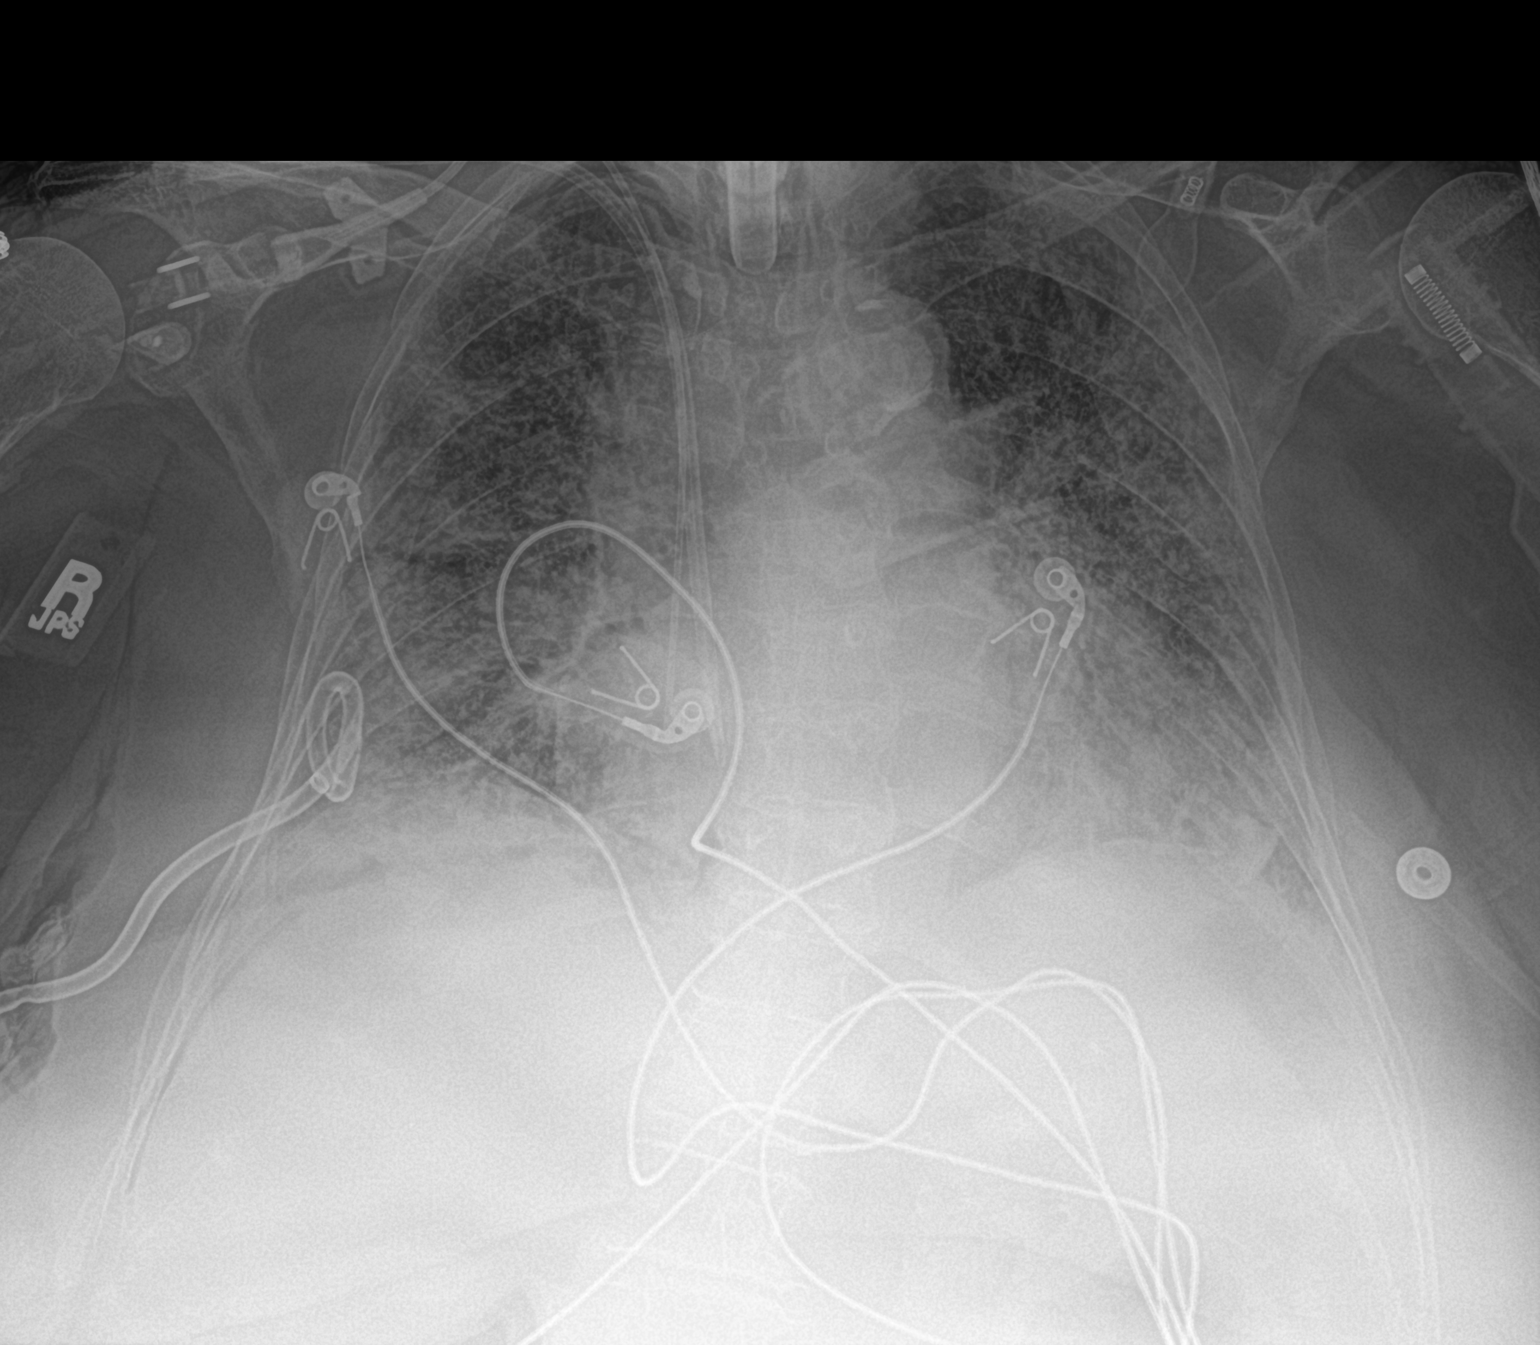

[1 of 1 positions shown; findings below may reference images not displayed]

FINDINGS: Decrease in right pneumothorax apical pneumothorax which is small
volume. Stable chest tube positioning at the base. Diffuse coarse
interstitial opacity. Cardiomegaly. Tracheostomy tube in place. Two
right-sided central lines in place.
IMPRESSION: 1. Decrease in the small right pneumothorax.
2. Stable hardware positioning and generalized infiltrates.
